# Patient Record
Sex: Female | Born: 1965 | Race: White | Hispanic: No | Smoking: Current every day smoker
Health system: Southern US, Community
[De-identification: ages and names within clinical notes are randomized; demographics above are authoritative.]

## PROBLEM LIST (undated history)

## (undated) DIAGNOSIS — M1990 Unspecified osteoarthritis, unspecified site: Secondary | ICD-10-CM

## (undated) DIAGNOSIS — N390 Urinary tract infection, site not specified: Secondary | ICD-10-CM

## (undated) DIAGNOSIS — E039 Hypothyroidism, unspecified: Secondary | ICD-10-CM

## (undated) DIAGNOSIS — I10 Essential (primary) hypertension: Secondary | ICD-10-CM

## (undated) DIAGNOSIS — L920 Granuloma annulare: Secondary | ICD-10-CM

## (undated) HISTORY — PX: OTHER SURGICAL HISTORY: SHX169

## (undated) HISTORY — DX: Urinary tract infection, site not specified: N39.0

## (undated) HISTORY — DX: Granuloma annulare: L92.0

## (undated) HISTORY — DX: Unspecified osteoarthritis, unspecified site: M19.90

## (undated) HISTORY — DX: Hypothyroidism, unspecified: E03.9

## (undated) HISTORY — DX: Essential (primary) hypertension: I10

---

## 1991-08-07 HISTORY — PX: TUBAL LIGATION: SHX77

## 1995-08-07 DIAGNOSIS — E039 Hypothyroidism, unspecified: Secondary | ICD-10-CM

## 1995-08-07 HISTORY — DX: Hypothyroidism, unspecified: E03.9

## 1999-04-21 ENCOUNTER — Ambulatory Visit (HOSPITAL_BASED_OUTPATIENT_CLINIC_OR_DEPARTMENT_OTHER): Admission: RE | Admit: 1999-04-21 | Discharge: 1999-04-21 | Payer: Self-pay | Admitting: General Surgery

## 1999-05-02 ENCOUNTER — Other Ambulatory Visit: Admission: RE | Admit: 1999-05-02 | Discharge: 1999-05-02 | Payer: Self-pay | Admitting: *Deleted

## 1999-05-02 ENCOUNTER — Encounter (INDEPENDENT_AMBULATORY_CARE_PROVIDER_SITE_OTHER): Payer: Self-pay | Admitting: Specialist

## 1999-05-23 ENCOUNTER — Encounter (INDEPENDENT_AMBULATORY_CARE_PROVIDER_SITE_OTHER): Payer: Self-pay

## 1999-05-23 ENCOUNTER — Other Ambulatory Visit: Admission: RE | Admit: 1999-05-23 | Discharge: 1999-05-23 | Payer: Self-pay | Admitting: Obstetrics and Gynecology

## 2003-03-20 ENCOUNTER — Emergency Department (HOSPITAL_COMMUNITY): Admission: EM | Admit: 2003-03-20 | Discharge: 2003-03-20 | Payer: Self-pay | Admitting: Emergency Medicine

## 2003-03-20 ENCOUNTER — Encounter: Payer: Self-pay | Admitting: Emergency Medicine

## 2005-06-20 ENCOUNTER — Encounter: Admission: RE | Admit: 2005-06-20 | Discharge: 2005-06-20 | Payer: Self-pay | Admitting: Family Medicine

## 2005-12-05 ENCOUNTER — Encounter: Admission: RE | Admit: 2005-12-05 | Discharge: 2006-01-24 | Payer: Self-pay | Admitting: Sports Medicine

## 2006-08-06 HISTORY — PX: APPENDECTOMY: SHX54

## 2008-12-07 ENCOUNTER — Ambulatory Visit: Payer: Self-pay

## 2008-12-07 ENCOUNTER — Ambulatory Visit: Payer: Self-pay | Admitting: Family Medicine

## 2008-12-07 DIAGNOSIS — R209 Unspecified disturbances of skin sensation: Secondary | ICD-10-CM | POA: Insufficient documentation

## 2008-12-07 DIAGNOSIS — N390 Urinary tract infection, site not specified: Secondary | ICD-10-CM | POA: Insufficient documentation

## 2008-12-07 DIAGNOSIS — R0602 Shortness of breath: Secondary | ICD-10-CM | POA: Insufficient documentation

## 2008-12-07 DIAGNOSIS — M79609 Pain in unspecified limb: Secondary | ICD-10-CM | POA: Insufficient documentation

## 2008-12-07 DIAGNOSIS — E669 Obesity, unspecified: Secondary | ICD-10-CM

## 2008-12-07 DIAGNOSIS — E0590 Thyrotoxicosis, unspecified without thyrotoxic crisis or storm: Secondary | ICD-10-CM | POA: Insufficient documentation

## 2008-12-13 ENCOUNTER — Telehealth (INDEPENDENT_AMBULATORY_CARE_PROVIDER_SITE_OTHER): Payer: Self-pay | Admitting: *Deleted

## 2008-12-27 ENCOUNTER — Encounter: Payer: Self-pay | Admitting: Family Medicine

## 2008-12-29 ENCOUNTER — Encounter (INDEPENDENT_AMBULATORY_CARE_PROVIDER_SITE_OTHER): Payer: Self-pay | Admitting: *Deleted

## 2009-01-24 ENCOUNTER — Encounter: Payer: Self-pay | Admitting: Pulmonary Disease

## 2009-10-03 ENCOUNTER — Ambulatory Visit: Payer: Self-pay | Admitting: Diagnostic Radiology

## 2009-10-03 ENCOUNTER — Encounter (INDEPENDENT_AMBULATORY_CARE_PROVIDER_SITE_OTHER): Payer: Self-pay

## 2009-10-03 ENCOUNTER — Encounter: Payer: Self-pay | Admitting: Emergency Medicine

## 2009-10-03 ENCOUNTER — Observation Stay (HOSPITAL_COMMUNITY): Admission: EM | Admit: 2009-10-03 | Discharge: 2009-10-04 | Payer: Self-pay

## 2010-10-26 LAB — CBC
HCT: 42.3 % (ref 36.0–46.0)
Hemoglobin: 14.4 g/dL (ref 12.0–15.0)
MCHC: 34.1 g/dL (ref 30.0–36.0)
MCV: 92.3 fL (ref 78.0–100.0)
Platelets: 187 10*3/uL (ref 150–400)
RBC: 4.58 MIL/uL (ref 3.87–5.11)
RDW: 13.6 % (ref 11.5–15.5)
WBC: 13.9 10*3/uL — ABNORMAL HIGH (ref 4.0–10.5)

## 2010-10-26 LAB — BASIC METABOLIC PANEL
BUN: 15 mg/dL (ref 6–23)
CO2: 27 mEq/L (ref 19–32)
Calcium: 8.8 mg/dL (ref 8.4–10.5)
Chloride: 108 mEq/L (ref 96–112)
Creatinine, Ser: 0.9 mg/dL (ref 0.4–1.2)
GFR calc Af Amer: >60 mL/min (ref >60)
GFR calc non Af Amer: >60 mL/min (ref >60)
Glucose, Bld: 98 mg/dL (ref 70–99)
Potassium: 4 mEq/L (ref 3.5–5.1)
Sodium: 141 mEq/L (ref 135–145)

## 2010-10-26 LAB — URINALYSIS, ROUTINE W REFLEX MICROSCOPIC
Bilirubin Urine: NEGATIVE
Glucose, UA: NEGATIVE mg/dL
Hgb urine dipstick: NEGATIVE
Ketones, ur: NEGATIVE mg/dL
Nitrite: NEGATIVE
Protein, ur: NEGATIVE mg/dL
Specific Gravity, Urine: 1.023 (ref 1.005–1.030)
Urobilinogen, UA: 0.2 mg/dL (ref 0.0–1.0)
pH: 7 (ref 5.0–8.0)

## 2010-10-26 LAB — DIFFERENTIAL
Basophils Absolute: 0 10*3/uL (ref 0.0–0.1)
Basophils Relative: 0 % (ref 0–1)
Eosinophils Absolute: 0.5 10*3/uL (ref 0.0–0.7)
Eosinophils Relative: 3 % (ref 0–5)
Lymphocytes Relative: 15 % (ref 12–46)
Lymphs Abs: 2.1 10*3/uL (ref 0.7–4.0)
Monocytes Absolute: 0.5 10*3/uL (ref 0.1–1.0)
Monocytes Relative: 4 % (ref 3–12)
Neutro Abs: 10.8 10*3/uL — ABNORMAL HIGH (ref 1.7–7.7)
Neutrophils Relative %: 78 % — ABNORMAL HIGH (ref 43–77)

## 2010-10-26 LAB — PREGNANCY, URINE: Preg Test, Ur: NEGATIVE

## 2010-10-31 ENCOUNTER — Emergency Department (INDEPENDENT_AMBULATORY_CARE_PROVIDER_SITE_OTHER): Payer: BC Managed Care – PPO

## 2010-10-31 ENCOUNTER — Emergency Department (HOSPITAL_BASED_OUTPATIENT_CLINIC_OR_DEPARTMENT_OTHER)
Admission: EM | Admit: 2010-10-31 | Discharge: 2010-10-31 | Disposition: A | Discharge status: Home / Self Care | Payer: BC Managed Care – PPO | Attending: Emergency Medicine | Admitting: Emergency Medicine

## 2010-10-31 DIAGNOSIS — M5137 Other intervertebral disc degeneration, lumbosacral region: Secondary | ICD-10-CM

## 2010-10-31 DIAGNOSIS — M5430 Sciatica, unspecified side: Secondary | ICD-10-CM | POA: Insufficient documentation

## 2010-10-31 DIAGNOSIS — M25559 Pain in unspecified hip: Secondary | ICD-10-CM

## 2010-10-31 DIAGNOSIS — E039 Hypothyroidism, unspecified: Secondary | ICD-10-CM | POA: Insufficient documentation

## 2010-10-31 DIAGNOSIS — M79609 Pain in unspecified limb: Secondary | ICD-10-CM | POA: Insufficient documentation

## 2010-12-22 ENCOUNTER — Ambulatory Visit: Payer: BC Managed Care – PPO | Admitting: Family Medicine

## 2010-12-25 ENCOUNTER — Encounter: Payer: Self-pay | Admitting: Family Medicine

## 2010-12-25 ENCOUNTER — Ambulatory Visit (INDEPENDENT_AMBULATORY_CARE_PROVIDER_SITE_OTHER): Payer: BC Managed Care – PPO | Admitting: Family Medicine

## 2010-12-25 VITALS — BP 154/95 | HR 84 | Temp 98.4°F | Ht 70.0 in | Wt 245.0 lb

## 2010-12-25 DIAGNOSIS — M545 Low back pain: Secondary | ICD-10-CM

## 2010-12-25 MED ORDER — HYDROCODONE-ACETAMINOPHEN 5-500 MG PO TABS: 1.0000 | ORAL_TABLET | Freq: Four times a day (QID) | ORAL | Status: AC | PRN

## 2010-12-25 MED ORDER — PREDNISONE (PAK) 10 MG PO TABS: ORAL_TABLET | ORAL | Status: DC

## 2010-12-25 MED ORDER — MELOXICAM 15 MG PO TABS: 15.0000 mg | ORAL_TABLET | Freq: Every day | ORAL | Status: AC

## 2010-12-25 NOTE — Patient Instructions (Signed)
You have lumbar radiculopathy (a pinched nerve in your low back). A prednisone dose pack is the best option for immediate relief and may be prescribed with transition to an anti-inflammatory like meloxicam (if you do not have stomach or kidney issues). Tramadol or vicodin as needed for severe pain (no driving on this medicine). Stay as active as possible. Physical therapy has been shown to be helpful as well. Strengthening of low back muscles, abdominal musculature are key for long term pain relief. If not improving, will consider further imaging (MRI) and/or other medications (neurontin, lyrica, nortriptyline) that help with pain. Follow up with me in 2 weeks (1 week if pain is the same or worse).

## 2010-12-25 NOTE — Assessment & Plan Note (Signed)
consistent with lumbar radiculopathy.  Mild DDD at L5-S1, pain is probably discogenic in nature.  Start prednisone dose pack with transition to mobic.  Did not like flexeril so will do without muscle relaxant.  Vicodin as needed for severe pain.  PT for exercises, stretches, modalities.  F/u in 2 weeks if improving, sooner if pain persists or worsens despite treatment.  Will consider MRI lumbar spine if not improving.

## 2010-12-25 NOTE — Progress Notes (Signed)
  Subjective:    Patient ID: Maria Robles, female    DOB: Apr 04, 1966, 45 y.o.   MRN: [id number]  HPI  45 yo F here with right low back pain.  Patient denies known injury. States about 3 months ago she did a lot of dancing one day - next day woke up and started to have slowly worsening low back pain, buttock pain with radiation into right leg past the knee. Worsened after long 11 1/2 hour drive home that day. Feels like she has spasms in buttock. + numbness and tingling lateral lower leg into foot. Went to ED in March - given valium then flexeril, oxycodone which helped with spasms but pain has persisted. Not taking nsaids and not doing any specific rehab or exercises. Tried acupuncture, some electrode therapy (at acupuncture place) without much help. Tried icing, sitting on bag of peas, massage, fish oil, vitamin D, home stretches. X-rays 3/27 showed mild DDD at L5-S1.  Past Medical History  Diagnosis Date  . Hypothyroidism     No current outpatient prescriptions on file prior to visit.    No past surgical history on file.  No Known Allergies  History   Social History  . Marital Status: Married    Spouse Name: N/A    Number of Children: N/A  . Years of Education: N/A   Occupational History  . Not on file.   Social History Main Topics  . Smoking status: Current Some Day Smoker  . Smokeless tobacco: Not on file   Comment: 1 pack per 2 weeks  . Alcohol Use: Not on file  . Drug Use: Not on file  . Sexually Active: Not on file   Other Topics Concern  . Not on file   Social History Narrative  . No narrative on file    Family History  Problem Relation Age of Onset  . Diabetes Neg Hx   . Heart attack Neg Hx   . Hypertension Neg Hx     BP 154/95  Pulse 84  Temp(Src) 98.4 F (36.9 C) (Oral)  Ht 5\' 10"  (1.778 m)  Wt 245 lb (111.131 kg)  BMI 35.15 kg/m2  Review of Systems See HPI above.    Objective:   Physical Exam Gen: NAD, overweight. Back: No  gross deformity, scoliosis. TTP just right of midline lumbar spine, right buttock.  No bony, midline, or left paraspinal TTP. FROM but pain on full flexion.  No pain with extension. Strength 5/5 BLEs. Sensation diminished lateral lower leg on right, intact on left. MSRs trace right patellar, 1+ left patellar, 2+ bilateral achilles tendons. Negative logroll bilateral hips SLR + on right, negative on left. Negative fabers and piriformis stretch (latter gives pain in low back).     Assessment & Plan:  1. Low back pain - consistent with lumbar radiculopathy.  Mild DDD at L5-S1, pain is probably discogenic in nature.  Start prednisone dose pack with transition to mobic.  Did not like flexeril so will do without muscle relaxant.  Vicodin as needed for severe pain.  PT for exercises, stretches, modalities.  F/u in 2 weeks if improving, sooner if pain persists or worsens despite treatment.  Will consider MRI lumbar spine if not improving.

## 2010-12-28 ENCOUNTER — Ambulatory Visit: Payer: BC Managed Care – PPO | Attending: Family Medicine | Admitting: Physical Therapy

## 2010-12-28 DIAGNOSIS — M545 Low back pain, unspecified: Secondary | ICD-10-CM | POA: Insufficient documentation

## 2010-12-28 DIAGNOSIS — M25559 Pain in unspecified hip: Secondary | ICD-10-CM | POA: Insufficient documentation

## 2010-12-28 DIAGNOSIS — IMO0001 Reserved for inherently not codable concepts without codable children: Secondary | ICD-10-CM | POA: Insufficient documentation

## 2011-01-03 ENCOUNTER — Ambulatory Visit: Payer: BC Managed Care – PPO | Admitting: Rehabilitation

## 2011-01-05 ENCOUNTER — Ambulatory Visit: Payer: BC Managed Care – PPO | Attending: Family Medicine | Admitting: Rehabilitation

## 2011-01-05 DIAGNOSIS — M545 Low back pain, unspecified: Secondary | ICD-10-CM | POA: Insufficient documentation

## 2011-01-05 DIAGNOSIS — M25559 Pain in unspecified hip: Secondary | ICD-10-CM | POA: Insufficient documentation

## 2011-01-05 DIAGNOSIS — IMO0001 Reserved for inherently not codable concepts without codable children: Secondary | ICD-10-CM | POA: Insufficient documentation

## 2011-01-09 ENCOUNTER — Ambulatory Visit: Payer: BC Managed Care – PPO | Admitting: Rehabilitation

## 2011-01-09 ENCOUNTER — Ambulatory Visit (INDEPENDENT_AMBULATORY_CARE_PROVIDER_SITE_OTHER): Payer: BC Managed Care – PPO | Admitting: Family Medicine

## 2011-01-09 ENCOUNTER — Encounter: Payer: Self-pay | Admitting: Family Medicine

## 2011-01-09 VITALS — BP 131/83 | HR 90 | Temp 98.1°F | Ht 70.0 in | Wt 240.0 lb

## 2011-01-09 DIAGNOSIS — M545 Low back pain: Secondary | ICD-10-CM

## 2011-01-09 NOTE — Assessment & Plan Note (Signed)
consistent with lumbar radiculopathy, significantly improved.  Continue with PT and transition to HEP.  Mobic as needed.  F/u prn - if pain worsens or persists, will consider MRI.

## 2011-01-09 NOTE — Progress Notes (Signed)
Subjective:    Patient ID: Maria Robles, female    DOB: [birth year], 45 y.o.   MRN: [id number]  HPI  45 yo F here with right low back pain.  Last OV 12/25/10: Patient denies known injury. States about 3 months ago she did a lot of dancing one day - next day woke up and started to have slowly worsening low back pain, buttock pain with radiation into right leg past the knee. Worsened after long 11 1/2 hour drive home that day. Feels like she has spasms in buttock. + numbness and tingling lateral lower leg into foot. Went to ED in March - given valium then flexeril, oxycodone which helped with spasms but pain has persisted. Not taking nsaids and not doing any specific rehab or exercises. Tried acupuncture, some electrode therapy (at acupuncture place) without much help. Tried icing, sitting on bag of peas, massage, fish oil, vitamin D, home stretches. X-rays 3/27 showed mild DDD at L5-S1.  Today: Patient has had significant improvement with PT, prednisone then mobic. No longer needing any medication. Pain not radiating into right leg any longer. If she overworks, next morning feels some spasms in right side lower back but they are now mild. No more numbness/tingling.  Past Medical History  Diagnosis Date  . Hypothyroidism     Current Outpatient Prescriptions on File Prior to Visit  Medication Sig Dispense Refill  . HYDROcodone-acetaminophen (VICODIN) 5-500 MG per tablet Take 1 tablet by mouth every 6 (six) hours as needed for pain.  40 tablet  0  . levothyroxine (SYNTHROID, LEVOTHROID) 100 MCG tablet Take 100 mcg by mouth daily.        . meloxicam (MOBIC) 15 MG tablet Take 1 tablet (15 mg total) by mouth daily. With food.  Start AFTER finishing prednisone.  30 tablet  1  . predniSONE (DELTASONE) 10 MG tablet pack 6 tabs po day 1, 5 tabs po day 2, 4 tabs po day 3, 3 tabs po day 4, 2 tabs po day 5, 1 tab po day 6   21 tablet  0    No past surgical history on file.  No Known  Allergies  History   Social History  . Marital Status: Married    Spouse Name: N/A    Number of Children: N/A  . Years of Education: N/A   Occupational History  . Not on file.   Social History Main Topics  . Smoking status: Current Some Day Smoker  . Smokeless tobacco: Not on file   Comment: 1 pack per 2 weeks  . Alcohol Use: Not on file  . Drug Use: Not on file  . Sexually Active: Not on file   Other Topics Concern  . Not on file   Social History Narrative  . No narrative on file    Family History  Problem Relation Age of Onset  . Diabetes Neg Hx   . Heart attack Neg Hx   . Hypertension Neg Hx     BP 131/83  Pulse 90  Temp(Src) 98.1 F (36.7 C) (Oral)  Ht 5\' 10"  (1.778 m)  Wt 240 lb (108.863 kg)  BMI 34.44 kg/m2  Review of Systems  See HPI above.    Objective:   Physical Exam  Gen: NAD, overweight. Back: No gross deformity, scoliosis. Mild TTP just right of midline lumbar spine, right buttock.  No bony, midline, or left paraspinal TTP. FROM with minimal pain on full flexion.  No pain with extension. Strength 5/5 BLEs.  Sensation intact BLEs (improved). MSRs trace bilateral patellar, 2+ bilateral achilles tendons. Negative logroll bilateral hips SLRs negative (improved).  Negative fabers and piriformis stretches     Assessment & Plan:  1. Low back pain - consistent with lumbar radiculopathy, significantly improved.  Continue with PT and transition to HEP.  Mobic as needed.  F/u prn - if pain worsens or persists, will consider MRI.

## 2011-01-11 ENCOUNTER — Encounter: Payer: BC Managed Care – PPO | Admitting: Physical Therapy

## 2011-01-17 ENCOUNTER — Ambulatory Visit: Payer: BC Managed Care – PPO | Admitting: Rehabilitation

## 2011-01-19 ENCOUNTER — Ambulatory Visit: Payer: BC Managed Care – PPO | Admitting: Rehabilitation

## 2011-01-29 ENCOUNTER — Encounter: Payer: Self-pay | Admitting: Family Medicine

## 2012-10-23 ENCOUNTER — Emergency Department (HOSPITAL_BASED_OUTPATIENT_CLINIC_OR_DEPARTMENT_OTHER)
Admission: EM | Admit: 2012-10-23 | Discharge: 2012-10-23 | Disposition: A | Discharge status: Home / Self Care | Payer: Self-pay | Attending: Emergency Medicine | Admitting: Emergency Medicine

## 2012-10-23 ENCOUNTER — Encounter (HOSPITAL_BASED_OUTPATIENT_CLINIC_OR_DEPARTMENT_OTHER): Payer: Self-pay | Admitting: *Deleted

## 2012-10-23 DIAGNOSIS — E039 Hypothyroidism, unspecified: Secondary | ICD-10-CM | POA: Insufficient documentation

## 2012-10-23 DIAGNOSIS — F17200 Nicotine dependence, unspecified, uncomplicated: Secondary | ICD-10-CM | POA: Insufficient documentation

## 2012-10-23 DIAGNOSIS — Z87440 Personal history of urinary (tract) infections: Secondary | ICD-10-CM | POA: Insufficient documentation

## 2012-10-23 DIAGNOSIS — Z79899 Other long term (current) drug therapy: Secondary | ICD-10-CM | POA: Insufficient documentation

## 2012-10-23 MED ORDER — LEVOTHYROXINE SODIUM 100 MCG PO TABS: 150.0000 ug | ORAL_TABLET | Freq: Every day | ORAL | Status: DC

## 2012-10-23 NOTE — ED Provider Notes (Signed)
History     CSN: [id number]  Arrival date & time 10/23/12  1447   First MD Initiated Contact with Patient 10/23/12 1536      Chief Complaint  Patient presents with  . Medication Refill    (Consider location/radiation/quality/duration/timing/severity/associated sxs/prior treatment) HPI Comments: Pt request a prescription for synthroid.  Pt reports she lost her insurance and is out.  Pt has been taking for 15 years.  Pt reports she feels tired and run down  The history is provided by the patient. No language interpreter was used.    Past Medical History  Diagnosis Date  . Hypothyroidism   . Chronic UTI     hx    Past Surgical History  Procedure Laterality Date  . Tubal ligation    . Benign tumor      R shoulder   . Appendectomy      Family History  Problem Relation Age of Onset  . Diabetes Neg Hx   . Heart attack Neg Hx   . Hypertension Neg Hx   . Coronary artery disease Neg Hx   . Stroke Neg Hx   . Cancer Neg Hx     breast or colon     History  Substance Use Topics  . Smoking status: Current Every Day Smoker -- 0.50 packs/day    Types: Cigarettes  . Smokeless tobacco: Never Used     Comment: 1 pack per 2 weeks  . Alcohol Use: Yes     Comment: occassionally    OB History   Grav Para Term Preterm Abortions TAB SAB Ect Mult Living                  Review of Systems  All other systems reviewed and are negative.    Allergies  Review of patient's allergies indicates no known allergies.  Home Medications   Current Outpatient Rx  Name  Route  Sig  Dispense  Refill  . ciprofloxacin (CIPRO) 500 MG tablet   Oral   Take 500 mg by mouth 2 (two) times daily as needed.           Marland Kitchen levothyroxine (SYNTHROID, LEVOTHROID) 100 MCG tablet   Oral   Take 1.5 tablets (150 mcg total) by mouth daily.   30 tablet   3   . naproxen (NAPROSYN) 500 MG tablet   Oral   Take 500 mg by mouth as needed.           . predniSONE (DELTASONE) 10 MG tablet pack     6 tabs po day 1, 5 tabs po day 2, 4 tabs po day 3, 3 tabs po day 4, 2 tabs po day 5, 1 tab po day 6    21 tablet   0   . zolpidem (AMBIEN CR) 12.5 MG CR tablet   Oral   Take 12.5 mg by mouth at bedtime as needed. Not sure of dose            BP 150/94  Pulse 73  Temp(Src) 98.1 F (36.7 C) (Oral)  Resp 18  SpO2 100%  LMP 10/09/2012  Physical Exam  Nursing note and vitals reviewed. Constitutional: She appears well-developed and well-nourished.  HENT:  Head: Normocephalic and atraumatic.  Eyes: Conjunctivae are normal. Pupils are equal, round, and reactive to light.  Neck: Normal range of motion. Neck supple.  Cardiovascular: Normal rate and normal heart sounds.   Pulmonary/Chest: Effort normal and breath sounds normal.  Musculoskeletal: Normal range of motion.  Neurological: She is alert.  Skin: Skin is warm.  Psychiatric: She has a normal mood and affect.    ED Course  Procedures (including critical care time)  Labs Reviewed - No data to display No results found.   1. Hypothyroid       MDM  Synthroid,   Pt advised to follow up with primary in April as scheduled        Elson Areas, PA-C 10/23/12 1630

## 2012-10-23 NOTE — ED Notes (Signed)
Pt states that she is out of her medication for Hypothyroidism, and is starting to feel like she is retaining fluid. She reports having an appointment at the free clinic on November 20, 2012, but is no longer comfortable waiting for her medication. Pt states that it is harder her to breath, she is cold and tired, which she states is the result of coming off of her medication.

## 2012-10-23 NOTE — ED Notes (Signed)
Pt states she has hypothyroidism and has been out of her medication for the last 2 weeks.  States she lost her insurance and is scheduled to go to the free clinic in [place] on 11/20/12.

## 2012-10-24 NOTE — ED Provider Notes (Signed)
Medical screening examination/treatment/procedure(s) were performed by non-physician practitioner and as supervising physician I was immediately available for consultation/collaboration.   Jaydin Jalomo, MD 10/24/12 0734 

## 2013-03-10 ENCOUNTER — Encounter: Payer: Self-pay | Admitting: Internal Medicine

## 2013-03-10 ENCOUNTER — Ambulatory Visit: Payer: Self-pay | Attending: Family Medicine | Admitting: Internal Medicine

## 2013-03-10 VITALS — BP 134/79 | HR 67 | Temp 98.0°F | Resp 17 | Wt 253.8 lb

## 2013-03-10 DIAGNOSIS — E039 Hypothyroidism, unspecified: Secondary | ICD-10-CM | POA: Insufficient documentation

## 2013-03-10 MED ORDER — LEVOTHYROXINE SODIUM 150 MCG PO TABS: 150.0000 ug | ORAL_TABLET | Freq: Every day | ORAL | Status: DC

## 2013-03-10 NOTE — Progress Notes (Signed)
Patient here to establish care Medication refill for thyroid

## 2013-03-10 NOTE — Progress Notes (Signed)
Patient ID: Maria Robles, female   DOB: Feb 21, 1966, 47 y.o.   MRN: [id number]  CC: To establish care and refill of medication  HPI: Maria Robles is a 47 years old woman with history of hypothyroidism came into clinic today to establish care. She has no specific complaints, no diarrhea, no constipation, no abdominal pain, no shortness of breath. She denies any chest, no skin dryness. She has not had a physical done in a long time because she has no primary care physician. There is no history of high Blood-pressure, no diabetes.  Allergies  Allergen Reactions  . Sulfa Antibiotics    Past Medical History  Diagnosis Date  . Hypothyroidism   . Chronic UTI     hx   Current Outpatient Prescriptions on File Prior to Visit  Medication Sig Dispense Refill  . ciprofloxacin (CIPRO) 500 MG tablet Take 500 mg by mouth 2 (two) times daily as needed.        . naproxen (NAPROSYN) 500 MG tablet Take 500 mg by mouth as needed.        . predniSONE (DELTASONE) 10 MG tablet pack 6 tabs po day 1, 5 tabs po day 2, 4 tabs po day 3, 3 tabs po day 4, 2 tabs po day 5, 1 tab po day 6   21 tablet  0  . zolpidem (AMBIEN CR) 12.5 MG CR tablet Take 12.5 mg by mouth at bedtime as needed. Not sure of dose        No current facility-administered medications on file prior to visit.   Family History  Problem Relation Age of Onset  . Diabetes Neg Hx   . Heart attack Neg Hx   . Hypertension Neg Hx   . Coronary artery disease Neg Hx   . Stroke Neg Hx   . Cancer Neg Hx     breast or colon    History   Social History  . Marital Status: Married    Spouse Name: N/A    Number of Children: N/A  . Years of Education: N/A   Occupational History  . Not on file.   Social History Main Topics  . Smoking status: Current Every Day Smoker -- 0.50 packs/day    Types: Cigarettes  . Smokeless tobacco: Never Used     Comment: 1 pack per 2 weeks  . Alcohol Use: Yes     Comment: occassionally  . Drug Use: No  . Sexually  Active: Not on file   Other Topics Concern  . Not on file   Social History Narrative   Married, 2 boys-1 grandchild.    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 HENT: Negative for ear pain, nosebleeds, congestion, facial swelling, rhinorrhea, neck pain, neck stiffness and ear discharge.  Eyes: Negative for pain, discharge, redness, itching and visual disturbance. Respiratory: Negative for cough, choking, chest tightness, shortness of breath, wheezing and stridor.  Cardiovascular: Negative for chest pain, palpitations and leg swelling. Gastrointestinal: Negative for abdominal distention. Genitourinary: Negative for dysuria, urgency, frequency, hematuria, flank pain, decreased urine volume, difficulty urinating and dyspareunia.  Musculoskeletal: Negative for back pain, joint swelling, arthralgias and gait problem. Neurological: Negative for dizziness, tremors, seizures, syncope, facial asymmetry, speech difficulty, weakness, light-headedness, numbness and headaches.  Hematological: Negative for adenopathy. Does not bruise/bleed easily. Psychiatric/Behavioral: Negative for hallucinations, behavioral problems, confusion, dysphoric mood, decreased concentration and agitation.  Objective:   Filed Vitals:   03/10/13 1153  BP: 134/79  Pulse:   Temp:   Resp:     Physical Exam: Constitutional: Patient appears well-developed and well-nourished. No distress. HENT: Normocephalic, atraumatic, External right and left ear normal. Oropharynx is clear and moist.  Eyes: Conjunctivae and EOM are normal. PERRLA, no scleral icterus. Neck: Normal ROM. Neck supple. No JVD. No tracheal deviation. No thyromegaly. CVS: RRR, S1/S2 +, no murmurs, no gallops, no carotid bruit.  Pulmonary: Effort and breath sounds normal, no stridor, rhonchi, wheezes, rales.  Abdominal: Soft. BS +,   no distension, tenderness, rebound or guarding.  Musculoskeletal: Normal range of motion. No edema and no tenderness.  Lymphadenopathy: No lymphadenopathy noted, cervical, inguinal or axillary Neuro: Alert. Normal reflexes, muscle tone coordination. No cranial nerve deficit. Skin: Skin is warm and dry. No rash noted. Not diaphoretic. No erythema. No pallor. Psychiatric: Normal mood and affect. Behavior, judgment, thought content normal.  Lab Results  Component Value Date   WBC 13.9* 10/03/2009   HGB 14.4 10/03/2009   HCT 42.3 10/03/2009   MCV 92.3 10/03/2009   PLT 187 10/03/2009   Lab Results  Component Value Date   CREATININE .9 10/03/2009   BUN 15 10/03/2009   NA 141 10/03/2009   K 4.0 10/03/2009   CL 108 10/03/2009   CO2 27 10/03/2009    No results found for this basename: HGBA1C   Lipid Panel  No results found for this basename: chol, trig, hdl, cholhdl, vldl, ldlcalc       Assessment and plan:   Patient Active Problem List   Diagnosis Date Noted  . Unspecified hypothyroidism 03/10/2013  . Low back pain 12/25/2010  . OBESITY 12/07/2008  . UTI'S, RECURRENT 12/07/2008  . LEG PAIN, RIGHT 12/07/2008  . ARM NUMBNESS 12/07/2008  . SHORTNESS OF BREATH 12/07/2008   Plan: Labs today: TSH and free T4 Levothyroxine 150 mcg tablet by mouth daily Patient is to schedule an appointment for a physical The patient was extensively counseled for smoking cessation Return to clinic in 2 months for followup and physical  Maria Robles was given clear instructions to go to ER or return to the clinic if symptoms don't improve, worsen or new problems develop.  Maria Robles verbalized understanding.  Maria Robles was told to call to get lab results if hasn't heard anything in the next week.        Maria Lewandowsky, MD [facility] [place], [place] [phone]   03/10/2013, 12:18 PM

## 2013-03-11 LAB — T4, FREE: Free T4: 0.55 ng/dL — ABNORMAL LOW (ref 0.80–1.80)

## 2013-05-11 ENCOUNTER — Ambulatory Visit: Payer: Self-pay

## 2013-06-26 ENCOUNTER — Other Ambulatory Visit: Payer: Self-pay | Admitting: Internal Medicine

## 2013-06-29 ENCOUNTER — Other Ambulatory Visit: Payer: Self-pay | Admitting: Internal Medicine

## 2013-06-29 NOTE — Telephone Encounter (Signed)
i refilled her medication but patient needs an appointment Has not been seen since august

## 2013-09-14 ENCOUNTER — Ambulatory Visit: Payer: Self-pay | Admitting: Internal Medicine

## 2013-11-09 ENCOUNTER — Other Ambulatory Visit: Payer: Self-pay | Admitting: *Deleted

## 2013-11-09 DIAGNOSIS — E039 Hypothyroidism, unspecified: Secondary | ICD-10-CM

## 2013-11-09 MED ORDER — LEVOTHYROXINE SODIUM 150 MCG PO TABS: 150.0000 ug | ORAL_TABLET | Freq: Every day | ORAL | Status: DC

## 2013-12-22 ENCOUNTER — Ambulatory Visit: Payer: Self-pay | Admitting: Internal Medicine

## 2014-03-08 ENCOUNTER — Ambulatory Visit: Payer: Self-pay | Admitting: Family Medicine

## 2014-04-15 ENCOUNTER — Emergency Department (HOSPITAL_BASED_OUTPATIENT_CLINIC_OR_DEPARTMENT_OTHER)
Admission: EM | Admit: 2014-04-15 | Discharge: 2014-04-15 | Disposition: A | Discharge status: Home / Self Care | Payer: PRIVATE HEALTH INSURANCE | Attending: Emergency Medicine | Admitting: Emergency Medicine

## 2014-04-15 ENCOUNTER — Encounter (HOSPITAL_BASED_OUTPATIENT_CLINIC_OR_DEPARTMENT_OTHER): Payer: Self-pay | Admitting: Emergency Medicine

## 2014-04-15 ENCOUNTER — Emergency Department (HOSPITAL_BASED_OUTPATIENT_CLINIC_OR_DEPARTMENT_OTHER): Payer: PRIVATE HEALTH INSURANCE

## 2014-04-15 DIAGNOSIS — R0789 Other chest pain: Secondary | ICD-10-CM | POA: Diagnosis not present

## 2014-04-15 DIAGNOSIS — Z791 Long term (current) use of non-steroidal anti-inflammatories (NSAID): Secondary | ICD-10-CM | POA: Insufficient documentation

## 2014-04-15 DIAGNOSIS — Z792 Long term (current) use of antibiotics: Secondary | ICD-10-CM | POA: Diagnosis not present

## 2014-04-15 DIAGNOSIS — F17200 Nicotine dependence, unspecified, uncomplicated: Secondary | ICD-10-CM | POA: Diagnosis not present

## 2014-04-15 DIAGNOSIS — R0602 Shortness of breath: Secondary | ICD-10-CM | POA: Diagnosis not present

## 2014-04-15 DIAGNOSIS — H8110 Benign paroxysmal vertigo, unspecified ear: Secondary | ICD-10-CM

## 2014-04-15 DIAGNOSIS — IMO0002 Reserved for concepts with insufficient information to code with codable children: Secondary | ICD-10-CM | POA: Insufficient documentation

## 2014-04-15 DIAGNOSIS — Z87440 Personal history of urinary (tract) infections: Secondary | ICD-10-CM | POA: Diagnosis not present

## 2014-04-15 DIAGNOSIS — Z760 Encounter for issue of repeat prescription: Secondary | ICD-10-CM | POA: Insufficient documentation

## 2014-04-15 DIAGNOSIS — R42 Dizziness and giddiness: Secondary | ICD-10-CM | POA: Diagnosis not present

## 2014-04-15 DIAGNOSIS — Z79899 Other long term (current) drug therapy: Secondary | ICD-10-CM | POA: Insufficient documentation

## 2014-04-15 DIAGNOSIS — E039 Hypothyroidism, unspecified: Secondary | ICD-10-CM | POA: Diagnosis not present

## 2014-04-15 LAB — CBC WITH DIFFERENTIAL/PLATELET
BASOS ABS: 0 10*3/uL (ref 0.0–0.1)
BASOS PCT: 0 % (ref 0–1)
Eosinophils Absolute: 0.3 10*3/uL (ref 0.0–0.7)
Eosinophils Relative: 4 % (ref 0–5)
HCT: 40.7 % (ref 36.0–46.0)
Hemoglobin: 13.8 g/dL (ref 12.0–15.0)
LYMPHS PCT: 34 % (ref 12–46)
Lymphs Abs: 2.5 10*3/uL (ref 0.7–4.0)
MCH: 31.9 pg (ref 26.0–34.0)
MCHC: 33.9 g/dL (ref 30.0–36.0)
MCV: 94 fL (ref 78.0–100.0)
Monocytes Absolute: 0.5 10*3/uL (ref 0.1–1.0)
Monocytes Relative: 7 % (ref 3–12)
NEUTROS ABS: 4.1 10*3/uL (ref 1.7–7.7)
NEUTROS PCT: 55 % (ref 43–77)
PLATELETS: 180 10*3/uL (ref 150–400)
RBC: 4.33 MIL/uL (ref 3.87–5.11)
RDW: 14.3 % (ref 11.5–15.5)
WBC: 7.4 10*3/uL (ref 4.0–10.5)

## 2014-04-15 LAB — BASIC METABOLIC PANEL
ANION GAP: 10 (ref 5–15)
BUN: 22 mg/dL (ref 6–23)
CHLORIDE: 102 meq/L (ref 96–112)
CO2: 27 meq/L (ref 19–32)
Calcium: 9.6 mg/dL (ref 8.4–10.5)
Creatinine, Ser: 1.2 mg/dL — ABNORMAL HIGH (ref 0.50–1.10)
GFR calc Af Amer: 60 mL/min — ABNORMAL LOW (ref >90)
GFR calc non Af Amer: 52 mL/min — ABNORMAL LOW (ref >90)
Glucose, Bld: 97 mg/dL (ref 70–99)
POTASSIUM: 4 meq/L (ref 3.7–5.3)
SODIUM: 139 meq/L (ref 137–147)

## 2014-04-15 LAB — TSH: TSH: >100 u[IU]/mL — ABNORMAL HIGH (ref 0.350–4.500)

## 2014-04-15 LAB — TROPONIN I: Troponin I: <0.3 ng/mL (ref <0.30)

## 2014-04-15 MED ORDER — MECLIZINE HCL 12.5 MG PO TABS: 25.0000 mg | ORAL_TABLET | Freq: Two times a day (BID) | ORAL | Status: DC

## 2014-04-15 MED ORDER — LEVOTHYROXINE SODIUM 150 MCG PO TABS: 150.0000 ug | ORAL_TABLET | Freq: Every day | ORAL | Status: DC

## 2014-04-15 NOTE — ED Provider Notes (Addendum)
Medical screening examination/treatment/procedure(s) were conducted as a shared visit with non-physician practitioner(s) and myself.  I personally evaluated the patient during the encounter.   EKG Interpretation None      Date: 04/15/2014  Rate: 60  Rhythm: normal sinus rhythm  QRS Axis: normal  Intervals: normal  ST/T Wave abnormalities: normal  Conduction Disutrbances:none  Narrative Interpretation:   Old EKG Reviewed: none available  Patient with history of hypothyroidism. Patient has been out of her Synthroid for approximately 8 weeks. Patient is having symptoms consistent with hypothyroidism in addition to some shortness of breath brain fog and perhaps vertigo. Here we will send off a baseline TSH. Patient's EKG without any acute changes. Patient will have basic lab workup as well. Patient will be restarted on her Synthroid.   Patient also with complaint of of additional type vertigo for the past 6 weeks. Patient said vertigo in the past as well but has never had a workup for her. Patient never had an MRI or head CT. We will get that done today. If that is negative patient can be discharged home on her Synthroid.    Results for orders placed during the hospital encounter of 04/15/14  TROPONIN I      Result Value Ref Range   Troponin I <0.30  <0.30 ng/mL  CBC WITH DIFFERENTIAL      Result Value Ref Range   WBC 7.4  4.0 - 10.5 K/uL   RBC 4.33  3.87 - 5.11 MIL/uL   Hemoglobin 13.8  12.0 - 15.0 g/dL   HCT 96.0  45.4 - 09.8 %   MCV 94.0  78.0 - 100.0 fL   MCH 31.9  26.0 - 34.0 pg   MCHC 33.9  30.0 - 36.0 g/dL   RDW 11.9  14.7 - 82.9 %   Platelets 180  150 - 400 K/uL   Neutrophils Relative % 55  43 - 77 %   Neutro Abs 4.1  1.7 - 7.7 K/uL   Lymphocytes Relative 34  12 - 46 %   Lymphs Abs 2.5  0.7 - 4.0 K/uL   Monocytes Relative 7  3 - 12 %   Monocytes Absolute 0.5  0.1 - 1.0 K/uL   Eosinophils Relative 4  0 - 5 %   Eosinophils Absolute 0.3  0.0 - 0.7 K/uL   Basophils  Relative 0  0 - 1 %   Basophils Absolute 0.0  0.0 - 0.1 K/uL  BASIC METABOLIC PANEL      Result Value Ref Range   Sodium 139  137 - 147 mEq/L   Potassium 4.0  3.7 - 5.3 mEq/L   Chloride 102  96 - 112 mEq/L   CO2 27  19 - 32 mEq/L   Glucose, Bld 97  70 - 99 mg/dL   BUN 22  6 - 23 mg/dL   Creatinine, Ser 5.62 (*) 0.50 - 1.10 mg/dL   Calcium 9.6  8.4 - 13.0 mg/dL   GFR calc non Af Amer 52 (*) >90 mL/min   GFR calc Af Amer 60 (*) >90 mL/min   Anion gap 10  5 - 15   Dg Chest 2 View  04/15/2014   CLINICAL DATA:  Shortness of breath and chest pain.  EXAM: CHEST  2 VIEW  COMPARISON:  None.  FINDINGS: Heart size and mediastinal contours are within normal limits. Both lungs are clear. Visualized skeletal structures are unremarkable.  IMPRESSION: Negative exam.   Electronically Signed   By: Drusilla Kanner  M.D.   On: 04/15/2014 13:00   Ct Head Wo Contrast  04/15/2014   CLINICAL DATA:  48 year old female with a history of hypothyroidism. Vertigo. Shortness of breath.  EXAM: CT HEAD WITHOUT CONTRAST  TECHNIQUE: Contiguous axial images were obtained from the base of the skull through the vertex without intravenous contrast.  COMPARISON:  None.  FINDINGS: Unremarkable appearance of the calvarium with no fracture, or aggressive bone lesion.  Unremarkable appearance the soft tissues. Unremarkable appearance of the orbits.  No significant paranasal sinus disease. Mastoid air cells remain patent.  No intracranial hemorrhage, midline shift, mass effect. Gray-white differentiation maintained with no evidence of acute ischemia. Unremarkable configuration of the ventricular system.  IMPRESSION: No CT evidence of acute intracranial abnormality.  Signed,  Yvone Neu. Loreta Ave, DO  [facility]  [facility]   Electronically Signed   By: Gilmer Mor O.D.   On: 04/15/2014 14:49    Patient's CT of her head is without any significant findings. Chest x-ray is negative basic labs  without significant abnormalities. Patient's glucose is normal. Patient will be started on her Synthroid started on anti-birth for the benign positional vertigo.     Vanetta Mulders, MD 04/15/14 1326  Vanetta Mulders, MD 04/15/14 1431  Vanetta Mulders, MD 04/15/14 1531

## 2014-04-15 NOTE — ED Notes (Signed)
States she ran out of her Synthroid 8 weeks ago. She cannot see her MD for a refil for 3 more weeks. She states she is now having symptoms of hypothyroidism with vertigo, sob, and brain fog.

## 2014-04-15 NOTE — Discharge Instructions (Signed)
Continue taking Synthroid as prescribed. Followup with your primary care physician. Use meclizine 2 times a day as needed for vertigo/dizziness. Return to the ER if symptoms worsen.  Hypothyroidism The thyroid is a large gland located in the lower front of your neck. The thyroid gland helps control metabolism. Metabolism is how your body handles food. It controls metabolism with the hormone thyroxine. When this gland is underactive (hypothyroid), it produces too little hormone.  CAUSES These include:   Absence or destruction of thyroid tissue.  Goiter due to iodine deficiency.  Goiter due to medications.  Congenital defects (since birth).  Problems with the pituitary. This causes a lack of TSH (thyroid stimulating hormone). This hormone tells the thyroid to turn out more hormone. SYMPTOMS  Lethargy (feeling as though you have no energy)  Cold intolerance  Weight gain (in spite of normal food intake)  Dry skin  Coarse hair  Menstrual irregularity (if severe, may lead to infertility)  Slowing of thought processes Cardiac problems are also caused by insufficient amounts of thyroid hormone. Hypothyroidism in the newborn is cretinism, and is an extreme form. It is important that this form be treated adequately and immediately or it will lead rapidly to retarded physical and mental development. DIAGNOSIS  To prove hypothyroidism, your caregiver may do blood tests and ultrasound tests. Sometimes the signs are hidden. It may be necessary for your caregiver to watch this illness with blood tests either before or after diagnosis and treatment. TREATMENT  Low levels of thyroid hormone are increased by using synthetic thyroid hormone. This is a safe, effective treatment. It usually takes about four weeks to gain the full effects of the medication. After you have the full effect of the medication, it will generally take another four weeks for problems to leave. Your caregiver may start you on  low doses. If you have had heart problems the dose may be gradually increased. It is generally not an emergency to get rapidly to normal. HOME CARE INSTRUCTIONS   Take your medications as your caregiver suggests. Let your caregiver know of any medications you are taking or start taking. Your caregiver will help you with dosage schedules.  As your condition improves, your dosage needs may increase. It will be necessary to have continuing blood tests as suggested by your caregiver.  Report all suspected medication side effects to your caregiver. SEEK MEDICAL CARE IF: Seek medical care if you develop:  Sweating.  Tremulousness (tremors).  Anxiety.  Rapid weight loss.  Heat intolerance.  Emotional swings.  Diarrhea.  Weakness. SEEK IMMEDIATE MEDICAL CARE IF:  You develop chest pain, an irregular heart beat (palpitations), or a rapid heart beat. MAKE SURE YOU:   Understand these instructions.  Will watch your condition.  Will get help right away if you are not doing well or get worse. Document Released: 07/23/2005 Document Revised: 10/15/2011 Document Reviewed: 03/12/2008 [facility] Patient Information 2015 [place], [place]. This information is not intended to replace advice given to you by your health care provider. Make sure you discuss any questions you have with your health care provider.  Benign Positional Vertigo Vertigo means you feel like you or your surroundings are moving when they are not. Benign positional vertigo is the most common form of vertigo. Benign means that the cause of your condition is not serious. Benign positional vertigo is more common in older adults. CAUSES  Benign positional vertigo is the result of an upset in the labyrinth system. This is an area in the middle  ear that helps control your balance. This may be caused by a viral infection, head injury, or repetitive motion. However, often no specific cause is found. SYMPTOMS  Symptoms of benign  positional vertigo occur when you move your head or eyes in different directions. Some of the symptoms may include:  Loss of balance and falls.  Vomiting.  Blurred vision.  Dizziness.  Nausea.  Involuntary eye movements (nystagmus). DIAGNOSIS  Benign positional vertigo is usually diagnosed by physical exam. If the specific cause of your benign positional vertigo is unknown, your caregiver may perform imaging tests, such as magnetic resonance imaging (MRI) or computed tomography (CT). TREATMENT  Your caregiver may recommend movements or procedures to correct the benign positional vertigo. Medicines such as meclizine, benzodiazepines, and medicines for nausea may be used to treat your symptoms. In rare cases, if your symptoms are caused by certain conditions that affect the inner ear, you may need surgery. HOME CARE INSTRUCTIONS   Follow your caregiver's instructions.  Move slowly. Do not make sudden body or head movements.  Avoid driving.  Avoid operating heavy machinery.  Avoid performing any tasks that would be dangerous to you or others during a vertigo episode.  Drink enough fluids to keep your urine clear or pale yellow. SEEK IMMEDIATE MEDICAL CARE IF:   You develop problems with walking, weakness, numbness, or using your arms, hands, or legs.  You have difficulty speaking.  You develop severe headaches.  Your nausea or vomiting continues or gets worse.  You develop visual changes.  Your family or friends notice any behavioral changes.  Your condition gets worse.  You have a fever.  You develop a stiff neck or sensitivity to light. MAKE SURE YOU:   Understand these instructions.  Will watch your condition.  Will get help right away if you are not doing well or get worse. Document Released: 04/30/2006 Document Revised: 10/15/2011 Document Reviewed: 04/12/2011 [facility] Patient Information 2015 [place], [place]. This information is not intended to replace  advice given to you by your health care provider. Make sure you discuss any questions you have with your health care provider.

## 2014-04-15 NOTE — ED Provider Notes (Signed)
CSN: [id number]     Arrival date & time 04/15/14  1123 History   First MD Initiated Contact with Patient 04/15/14 1137     Chief Complaint  Patient presents with  . Medication Refill     (Consider location/radiation/quality/duration/timing/severity/associated sxs/prior Treatment) HPI Maria Robles is a 48 year old female who presents to the ER today for a refill on her Synthroid. Patient states she ran out of her Synthroid approximately 8 weeks ago, and noticed a gradual onset of fatigue approximately 6 weeks ago. Patient states approximately 3 weeks ago she also noted some shortness of breath" chest pressure". Patient states her fatigue is constant, and her shortness of breath and chest pressure occur while she is sedentary, and are alleviated by movement or activity. She states she has taken a multivitamin to help her symptoms with no relief. Patient states she's also been experiencing some dizziness which occurs when she turns her head while lying flat, and when she "sits up too fast". Patient denies associated lightheadedness, palpitations, weakness, nausea, vomiting, diarrhea, dysuria, abdominal pain. Patient states her symptoms feel identical to when she has been without Synthroid in the past.  Past Medical History  Diagnosis Date  . Hypothyroidism   . Chronic UTI     hx   Past Surgical History  Procedure Laterality Date  . Tubal ligation    . Benign tumor      R shoulder   . Appendectomy     Family History  Problem Relation Age of Onset  . Diabetes Neg Hx   . Heart attack Neg Hx   . Hypertension Neg Hx   . Coronary artery disease Neg Hx   . Stroke Neg Hx   . Cancer Neg Hx     breast or colon    History  Substance Use Topics  . Smoking status: Current Every Day Smoker -- 0.50 packs/day    Types: Cigarettes  . Smokeless tobacco: Never Used     Comment: 1 pack per 2 weeks  . Alcohol Use: Yes     Comment: occassionally   OB History   Grav Para Term Preterm Abortions TAB SAB  Ect Mult Living                 Review of Systems  Constitutional: Positive for fatigue. Negative for fever, chills, diaphoresis and unexpected weight change.  HENT: Negative for trouble swallowing and voice change.   Eyes: Negative for visual disturbance.  Respiratory: Positive for shortness of breath.   Cardiovascular: Positive for chest pain. Negative for palpitations and leg swelling.  Gastrointestinal: Negative for nausea, vomiting and abdominal pain.  Genitourinary: Negative for dysuria.  Musculoskeletal: Negative for neck pain.  Skin: Negative for rash.  Neurological: Positive for dizziness. Negative for syncope, speech difficulty, weakness, light-headedness, numbness and headaches.  Psychiatric/Behavioral: Negative.       Allergies  Sulfa antibiotics  Home Medications   Prior to Admission medications   Medication Sig Start Date End Date Taking? Authorizing Provider  ciprofloxacin (CIPRO) 500 MG tablet Take 500 mg by mouth 2 (two) times daily as needed.      Historical Provider, MD  levothyroxine (SYNTHROID, LEVOTHROID) 150 MCG tablet Take 1 tablet (150 mcg total) by mouth daily before breakfast. 04/15/14   Monte Fantasia, PA-C  meclizine (ANTIVERT) 12.5 MG tablet Take 2 tablets (25 mg total) by mouth 2 (two) times daily. 04/15/14   Monte Fantasia, PA-C  naproxen (NAPROSYN) 500 MG tablet Take 500 mg by mouth as  needed.      Historical Provider, MD  predniSONE (DELTASONE) 10 MG tablet pack 6 tabs po day 1, 5 tabs po day 2, 4 tabs po day 3, 3 tabs po day 4, 2 tabs po day 5, 1 tab po day 6  12/25/10   Lenda Kelp, MD  zolpidem (AMBIEN CR) 12.5 MG CR tablet Take 12.5 mg by mouth at bedtime as needed. Not sure of dose     Historical Provider, MD   BP 161/95  Pulse 76  Temp(Src) 98.6 F (37 C) (Oral)  Resp 20  Ht  (1.778 m)  Wt 235 lb (106.595 kg)  BMI 33.72 kg/m2  SpO2 100% Physical Exam  Nursing note and vitals reviewed. Constitutional: She is oriented to  person, place, and time. She appears well-developed and well-nourished. No distress.  HENT:  Head: Normocephalic and atraumatic.  Mouth/Throat: Oropharynx is clear and moist. No oropharyngeal exudate.  Eyes: Right eye exhibits no discharge. Left eye exhibits no discharge. No scleral icterus.  Neck: Normal range of motion.  Cardiovascular: Normal rate, regular rhythm and normal heart sounds.   No murmur heard. Pulmonary/Chest: Effort normal and breath sounds normal. No respiratory distress.  Abdominal: Soft. There is no tenderness.  Musculoskeletal: Normal range of motion. She exhibits no edema and no tenderness.  Neurological: She is alert and oriented to person, place, and time. She has normal strength. No cranial nerve deficit or sensory deficit. She displays a negative Romberg sign. Coordination and gait normal.  Skin: Skin is warm and dry. No rash noted. She is not diaphoretic.  Psychiatric: She has a normal mood and affect.    ED Course  Procedures (including critical care time) Labs Review Labs Reviewed  TSH - Abnormal; Notable for the following:    TSH >100.000 (*)    All other components within normal limits  BASIC METABOLIC PANEL - Abnormal; Notable for the following:    Creatinine, Ser 1.20 (*)    GFR calc non Af Amer 52 (*)    GFR calc Af Amer 60 (*)    All other components within normal limits  TROPONIN I  CBC WITH DIFFERENTIAL    Imaging Review Dg Chest 2 View  04/15/2014   CLINICAL DATA:  Shortness of breath and chest pain.  EXAM: CHEST  2 VIEW  COMPARISON:  None.  FINDINGS: Heart size and mediastinal contours are within normal limits. Both lungs are clear. Visualized skeletal structures are unremarkable.  IMPRESSION: Negative exam.   Electronically Signed   By: Drusilla Kanner M.D.   On: 04/15/2014 13:00   Ct Head Wo Contrast  04/15/2014   CLINICAL DATA:  48 year old female with a history of hypothyroidism. Vertigo. Shortness of breath.  EXAM: CT HEAD WITHOUT  CONTRAST  TECHNIQUE: Contiguous axial images were obtained from the base of the skull through the vertex without intravenous contrast.  COMPARISON:  None.  FINDINGS: Unremarkable appearance of the calvarium with no fracture, or aggressive bone lesion.  Unremarkable appearance the soft tissues. Unremarkable appearance of the orbits.  No significant paranasal sinus disease. Mastoid air cells remain patent.  No intracranial hemorrhage, midline shift, mass effect. Gray-white differentiation maintained with no evidence of acute ischemia. Unremarkable configuration of the ventricular system.  IMPRESSION: No CT evidence of acute intracranial abnormality.  Signed,  Yvone Neu. Loreta Ave, DO  [facility]  [facility]   Electronically Signed   By: Gilmer Mor O.D.   On: 04/15/2014 14:49  EKG Interpretation None      MDM   Final diagnoses:  Hypothyroidism, unspecified hypothyroidism type  Benign positional vertigo, unspecified laterality   48 year old female with past medical history of hypothyroidism presenting with 6 weeks of generalized fatigue and 3 weeks of chest "tightness" and shortness of breath. Patient stating her symptoms are identical to when she has run out of her Synthroid the past. Patient states she has not had a refill and been out of her Synthroid for approximately 8 weeks. Patient also reporting some "vertigo symptoms".  Patient notes some mild dizziness when moving from a lying to a sitting position, and when turning her head to the left while lying down. The dizziness subsides after she "sits still". W/u to include CBC, BMP, Troponin, EKG 12 lead, TSH.    Workup for chest pain and other causes of dizziness unremarkable. Well criteria negative for PE. patient EKG, troponin, chest x-ray negative. Based on labs, EKG and radiographic findings, along with patient's history of 3 weeks of this chest heaviness, which is relieved with activity, her pain  is not consistent with cardiac etiology. Head CT negative for any intracranial pathology. Patient's dizziness positional, with negative orthostatic vital signs and neuro exam benign. Patient remains nontoxic appearing and speaks in full, clear sentences in no acute distress. We will discharge patient at this time with prescription of meclizine, Synthroid and have her followup with her PCP. We encouraged patient to call or return to the ER should her symptoms change, worsen or should she have a questions or concerns.   BP 161/95  Pulse 76  Temp(Src) 98.6 F (37 C) (Oral)  Resp 20  Ht  (1.778 m)  Wt 235 lb (106.595 kg)  BMI 33.72 kg/m2  SpO2 100%   Signed,  Ladona Mow, PA-C 12:28 AM  This patient seen and discussed with doctor Vanetta Mulders, M.D.  Monte Fantasia, PA-C 04/16/14 0028

## 2014-08-06 DIAGNOSIS — L920 Granuloma annulare: Secondary | ICD-10-CM

## 2014-08-06 HISTORY — DX: Granuloma annulare: L92.0

## 2015-02-22 DIAGNOSIS — I10 Essential (primary) hypertension: Secondary | ICD-10-CM | POA: Insufficient documentation

## 2015-03-26 DIAGNOSIS — E039 Hypothyroidism, unspecified: Secondary | ICD-10-CM | POA: Insufficient documentation

## 2015-03-26 DIAGNOSIS — L920 Granuloma annulare: Secondary | ICD-10-CM

## 2015-03-26 DIAGNOSIS — R030 Elevated blood-pressure reading, without diagnosis of hypertension: Secondary | ICD-10-CM

## 2015-03-26 DIAGNOSIS — Z87440 Personal history of urinary (tract) infections: Secondary | ICD-10-CM | POA: Insufficient documentation

## 2015-05-24 ENCOUNTER — Ambulatory Visit: Payer: Self-pay | Admitting: Internal Medicine

## 2015-06-07 HISTORY — PX: OTHER SURGICAL HISTORY: SHX169

## 2015-06-22 ENCOUNTER — Encounter: Payer: Self-pay | Admitting: Internal Medicine

## 2015-06-22 ENCOUNTER — Ambulatory Visit (INDEPENDENT_AMBULATORY_CARE_PROVIDER_SITE_OTHER): Payer: Self-pay | Admitting: Internal Medicine

## 2015-06-22 VITALS — BP 150/94 | HR 80 | Ht 70.0 in | Wt 254.0 lb

## 2015-06-22 DIAGNOSIS — I889 Nonspecific lymphadenitis, unspecified: Secondary | ICD-10-CM

## 2015-06-22 MED ORDER — CEFTRIAXONE SODIUM 1 G IJ SOLR
1.0000 g | Freq: Once | INTRAMUSCULAR | Status: AC
  Administered 2015-06-22: 1 g via INTRAMUSCULAR
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
## 2015-06-23 ENCOUNTER — Ambulatory Visit (INDEPENDENT_AMBULATORY_CARE_PROVIDER_SITE_OTHER): Payer: No Typology Code available for payment source | Admitting: Internal Medicine

## 2015-06-23 VITALS — BP 140/90

## 2015-06-23 DIAGNOSIS — I889 Nonspecific lymphadenitis, unspecified: Secondary | ICD-10-CM

## 2015-06-23 NOTE — Progress Notes (Signed)
   Subjective:    Patient ID: Skipper ClicheStephanie N Hottel, female    DOB: November 07, 1965, 49 y.o.   MRN: [id number]  HPI  Left cervical lymphadenitis:  No fever.  Taking Ibuprofen rotating Acetominophen every 4 hours.  Helps with pain.  No definite fever.  No myalgias, very fatigued.  Left ear and jaw hurt.  Hurts to lie on left side.  No significant change from yesterday with pain. Swelling might be a bit more pronounced on left jaw today.      Review of Systems     Objective:   Physical Exam HEENT:  PERRL, EOMI, TMs pearly gray, throat without injection swelling,  or exudate, Broken left mandibular tooth still with pain on tapping, but no surrounding erythema or swelling of gingiva.   Nasal mucosa remains boggy and swollen with clear discharge Mastoids nontender and without swelling. Neck:  Induration without overlying erythema of skin still involving left anterior cervical area with induration now creeping over about mid mandible.  Tender to touch. No fluctuance.  Flushing of skin at anterior base of neck. Chest:  CTA CV:  RRR without murmur or rub       Assessment & Plan:  Left anterior cervical lymphadenitis with possible dental abscess as well. No improvement.  Switch [facility]. Warm pack area of swelling at least twice daily. Alternating ibuprofen and Tylenol for pain. Follow up again tomorrow afternoon.

## 2015-06-24 ENCOUNTER — Encounter: Payer: Self-pay | Admitting: Internal Medicine

## 2015-06-24 ENCOUNTER — Ambulatory Visit (INDEPENDENT_AMBULATORY_CARE_PROVIDER_SITE_OTHER): Payer: No Typology Code available for payment source | Admitting: Internal Medicine

## 2015-06-24 VITALS — BP 158/92 | HR 68 | Temp 98.0°F | Resp 16 | Wt 251.5 lb

## 2015-06-24 DIAGNOSIS — I889 Nonspecific lymphadenitis, unspecified: Secondary | ICD-10-CM

## 2015-06-24 MED ORDER — CEFTRIAXONE SODIUM 1 G IJ SOLR: 1.0000 g | Freq: Once | INTRAMUSCULAR | Status: DC

## 2015-06-24 MED ORDER — HYDROCODONE-ACETAMINOPHEN 5-325 MG PO TABS: ORAL_TABLET | ORAL | Status: DC

## 2015-06-24 MED ORDER — AMOXICILLIN-POT CLAVULANATE 875-125 MG PO TABS: 1.0000 | ORAL_TABLET | Freq: Two times a day (BID) | ORAL | Status: DC

## 2015-06-24 NOTE — Patient Instructions (Signed)
Continue Augmentin If increasing swelling and pain or worsening problems with swallowing, go to ED over weekend Call clinic if any problems over weekend and call will come to me. Stop plain Tylenol May try Aleve 2 tabs twice daily for pain Continue warm compresses to area of swelling and pain 2-4 times daily, especially after taking antibiotics.

## 2015-06-24 NOTE — Patient Instructions (Signed)
Warm pack area of swelling at least twice daily, about 1/2 hour after taking antibiotic.
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
## 2015-06-26 ENCOUNTER — Emergency Department (HOSPITAL_COMMUNITY)
Admission: EM | Admit: 2015-06-26 | Discharge: 2015-06-26 | Disposition: A | Discharge status: Other Acute Inpatient Hospital | Payer: Self-pay | Attending: Emergency Medicine | Admitting: Emergency Medicine

## 2015-06-26 ENCOUNTER — Emergency Department (HOSPITAL_COMMUNITY): Payer: PRIVATE HEALTH INSURANCE

## 2015-06-26 ENCOUNTER — Encounter (HOSPITAL_COMMUNITY): Payer: Self-pay

## 2015-06-26 DIAGNOSIS — Z79899 Other long term (current) drug therapy: Secondary | ICD-10-CM | POA: Insufficient documentation

## 2015-06-26 DIAGNOSIS — K047 Periapical abscess without sinus: Secondary | ICD-10-CM | POA: Insufficient documentation

## 2015-06-26 DIAGNOSIS — E039 Hypothyroidism, unspecified: Secondary | ICD-10-CM | POA: Insufficient documentation

## 2015-06-26 DIAGNOSIS — Z87440 Personal history of urinary (tract) infections: Secondary | ICD-10-CM | POA: Insufficient documentation

## 2015-06-26 DIAGNOSIS — F17210 Nicotine dependence, cigarettes, uncomplicated: Secondary | ICD-10-CM | POA: Insufficient documentation

## 2015-06-26 DIAGNOSIS — L03211 Cellulitis of face: Secondary | ICD-10-CM | POA: Insufficient documentation

## 2015-06-26 LAB — BASIC METABOLIC PANEL
Anion gap: 10 (ref 5–15)
BUN: 9 mg/dL (ref 6–20)
CHLORIDE: 103 mmol/L (ref 101–111)
CO2: 22 mmol/L (ref 22–32)
CREATININE: 1.03 mg/dL — AB (ref 0.44–1.00)
Calcium: 9 mg/dL (ref 8.9–10.3)
GFR calc Af Amer: >60 mL/min (ref >60)
GFR calc non Af Amer: >60 mL/min (ref >60)
GLUCOSE: 102 mg/dL — AB (ref 65–99)
POTASSIUM: 3.6 mmol/L (ref 3.5–5.1)
Sodium: 135 mmol/L (ref 135–145)

## 2015-06-26 LAB — CBC WITH DIFFERENTIAL/PLATELET
Basophils Absolute: 0.1 10*3/uL (ref 0.0–0.1)
Basophils Relative: 1 %
EOS PCT: 4 %
Eosinophils Absolute: 0.4 10*3/uL (ref 0.0–0.7)
HCT: 45.6 % (ref 36.0–46.0)
HEMOGLOBIN: 15.6 g/dL — AB (ref 12.0–15.0)
LYMPHS ABS: 2.4 10*3/uL (ref 0.7–4.0)
Lymphocytes Relative: 25 %
MCH: 31.6 pg (ref 26.0–34.0)
MCHC: 34.2 g/dL (ref 30.0–36.0)
MCV: 92.5 fL (ref 78.0–100.0)
MONOS PCT: 6 %
Monocytes Absolute: 0.6 10*3/uL (ref 0.1–1.0)
NEUTROS PCT: 64 %
Neutro Abs: 6.1 10*3/uL (ref 1.7–7.7)
Platelets: 214 10*3/uL (ref 150–400)
RBC: 4.93 MIL/uL (ref 3.87–5.11)
RDW: 13.6 % (ref 11.5–15.5)
WBC: 9.6 10*3/uL (ref 4.0–10.5)

## 2015-06-26 MED ORDER — CLINDAMYCIN PHOSPHATE 600 MG/50ML IV SOLN
600.0000 mg | Freq: Once | INTRAVENOUS | Status: AC
  Administered 2015-06-26: 600 mg via INTRAVENOUS
  Filled 2015-06-26: qty 50

## 2015-06-26 MED ORDER — MORPHINE SULFATE (PF) 4 MG/ML IV SOLN
4.0000 mg | Freq: Once | INTRAVENOUS | Status: AC
  Administered 2015-06-26: 4 mg via INTRAVENOUS
  Filled 2015-06-26: qty 1

## 2015-06-26 MED ORDER — ONDANSETRON HCL 4 MG/2ML IJ SOLN
4.0000 mg | Freq: Once | INTRAMUSCULAR | Status: AC
  Administered 2015-06-26: 4 mg via INTRAVENOUS
  Filled 2015-06-26: qty 2

## 2015-06-26 MED ORDER — IOHEXOL 300 MG/ML  SOLN
75.0000 mL | Freq: Once | INTRAMUSCULAR | Status: AC | PRN
Start: 2015-06-26 — End: 2015-06-26
  Administered 2015-06-26: 75 mL via INTRAVENOUS

## 2015-06-26 MED ORDER — OXYCODONE-ACETAMINOPHEN 5-325 MG PO TABS
2.0000 | ORAL_TABLET | Freq: Once | ORAL | Status: AC
Start: 2015-06-26 — End: 2015-06-26
  Administered 2015-06-26: 2 via ORAL
  Filled 2015-06-26: qty 2

## 2015-06-26 NOTE — ED Notes (Signed)
CALLED [facility] FOR TRANSFER

## 2015-06-26 NOTE — ED Notes (Signed)
Pt arrives with facial swelling at left  lower jaw since Tuesday. Pt has had Augmentin and injected abx without improvement.

## 2015-06-26 NOTE — Discharge Instructions (Signed)

## 2015-06-26 NOTE — ED Provider Notes (Addendum)
CSN: [id number]     Arrival date & time 06/26/15  8295 History   First MD Initiated Contact with Patient 06/26/15 [phone]     Chief Complaint  Patient presents with  . Facial Swelling   HPI  Maria Robles is a 49 y.o. F PMH significant for left sided facial swelling and pain since Tuesday. She has been seen at her PCP almost daily for her symptoms. Her PCP gave her Augmentin (she has taken 6 tablets but none this morning) and Vicodin, which has provided moderate relief. She describes her pain as 6/10 pain scale, constant, aching, and radiating from her face up to her jaw and down her neck periodically. She endorses "low grade fever" and nausea. No CP, abdominal pain, emesis, changes in bowel/bladder habits.   Broke back molar one year ago.   Past Medical History  Diagnosis Date  . Hypothyroidism   . Chronic UTI     hx--none since did the prophylatic Cipro for 1 year  . Granuloma annulare 2016   Past Surgical History  Procedure Laterality Date  . Benign tumor      R shoulder   . Tubal ligation  1993  . Appendectomy  2008   Family History  Problem Relation Age of Onset  . Diabetes Neg Hx   . Heart attack Neg Hx   . Hypertension Neg Hx   . Coronary artery disease Neg Hx   . Stroke Neg Hx   . Cancer Neg Hx     breast or colon    Social History  Substance Use Topics  . Smoking status: Current Every Day Smoker -- 0.20 packs/day for 0 years    Types: Cigarettes  . Smokeless tobacco: Never Used     Comment: 1 pack per 2 weeks  . Alcohol Use: Yes     Comment: occasionally   OB History    No data available     Review of Systems  Ten systems are reviewed and are negative for acute change except as noted in the HPI   Allergies  Sulfa antibiotics  Home Medications   Prior to Admission medications   Medication Sig Start Date End Date Taking? Authorizing Provider  amoxicillin-clavulanate (AUGMENTIN) 875-125 MG tablet Take 1 tablet by mouth 2 (two) times daily. 06/23/15    Julieanne Manson, MD  HYDROcodone-acetaminophen (NORCO/VICODIN) 5-325 MG tablet 1-2 tabs by mouth every 4 hours as needed for jaw pain 06/24/15   Julieanne Manson, MD  levothyroxine (SYNTHROID, LEVOTHROID) 150 MCG tablet Take 1 tablet (150 mcg total) by mouth daily before breakfast. 04/15/14   Ladona Mow, PA-C   BP 132/83 mmHg  Pulse 60  Temp(Src) 98 F (36.7 C) (Oral)  Resp 16  Ht  (1.803 m)  Wt 252 lb (114.306 kg)  BMI 35.16 kg/m2  SpO2 96%  LMP 06/22/2015 (Exact Date) Physical Exam  Constitutional: She appears well-developed and well-nourished. No distress.  HENT:  Head: Normocephalic and atraumatic.  Right Ear: External ear normal.  Left Ear: External ear normal.  Nose: Nose normal.  Mouth/Throat: Oropharynx is clear and moist. No oropharyngeal exudate.  Left sided facial swelling with area of induration. No drainage, erythema.   Eyes: Conjunctivae are normal. Pupils are equal, round, and reactive to light. Right eye exhibits no discharge. Left eye exhibits no discharge. No scleral icterus.  Neck: No tracheal deviation present.  Cardiovascular: Normal rate, regular rhythm, normal heart sounds and intact distal pulses.  Exam reveals no gallop and no friction  rub.   No murmur heard. Pulmonary/Chest: Effort normal and breath sounds normal. No respiratory distress. She has no wheezes. She has no rales. She exhibits no tenderness.  Abdominal: Soft. Bowel sounds are normal. She exhibits no distension and no mass. There is no tenderness. There is no rebound and no guarding.  Musculoskeletal: She exhibits no edema.  Lymphadenopathy:    She has no cervical adenopathy.  Neurological: She is alert. Coordination normal.  Skin: Skin is warm and dry. No rash noted. She is not diaphoretic. No erythema.  Psychiatric: She has a normal mood and affect. Her behavior is normal.  Nursing note and vitals reviewed.   ED Course  Procedures (including critical care time) Labs Review Labs  Reviewed  CBC WITH DIFFERENTIAL/PLATELET - Abnormal; Notable for the following:    Hemoglobin 15.6 (*)    All other components within normal limits  BASIC METABOLIC PANEL - Abnormal; Notable for the following:    Glucose, Bld 102 (*)    Creatinine, Ser 1.03 (*)    All other components within normal limits    Imaging Review Ct Soft Tissue Neck W Contrast  06/26/2015  CLINICAL DATA:  49 year old female with a history of swelling of the neck and jaw for 1 week. Low-grade fevers. EXAM: CT NECK WITH CONTRAST TECHNIQUE: Multidetector CT imaging of the neck was performed using the standard protocol following the bolus administration of intravenous contrast. CONTRAST:  75mL OMNIPAQUE IOHEXOL 300 MG/ML  SOLN COMPARISON:  Head CT 04/15/2014 FINDINGS: Skull base: Visualized intracranial structures unremarkable. No paranasal sinus disease.  Mastoid air cells clear. No displaced skull base fracture. Orbits: Unremarkable appearance of the orbits and globes. Cervical: No fracture of the cervical spine. No significant degenerative changes or bony canal narrowing. Craniocervical junction unremarkable. Vasculature: Unremarkable appearance of the visualized aortic arch. Branch vessels patent proximally. Bilateral ICA patent. Bilateral vertebral arteries patent. Bilateral internal jugular veins patent. Unremarkable appearance of the facial veins. Soft tissues: Asymmetric soft tissues of the face, with infiltrative density in the left submandibular region involving the platysma. Platysma is thickened along the left submandibular region and neck, with reactive lymph node in the left submandibular region. Mandible: No mandibular fracture. Periapical lucency of the left mandibular molar teeth with evidence of dental caries of the left mandibular teeth. Rim enhancing low-density collection along the medial mandibular body, adjacent to the diseased mandibular molars teeth. Coronal reformatted images demonstrate greatest diameter  of 17 mm. Greatest AP diameter on axial source images measures approximately 15 mm. Inflammatory changes contribute to minimal mass effect on the adjacent floor of mouth musculature. Hypopharynx tissues unremarkable with unremarkable appearance of the epiglottis. Unremarkable appearance of the vocal cords and laryngeal cartilage. Unremarkable appearance of the thoracic inlet with no supraclavicular adenopathy. Unremarkable thyroid. Bilateral cervical/jugular lymph nodes. No displaced fracture. Degenerative changes of the cervical spine without bony canal narrowing. Upper thorax: Geographic ground-glass of the bilateral lung apices, potentially atelectasis or developing emphysema. Correlation with prior history of smoking may be useful. IMPRESSION: Subperiosteal abscess along the medial left mandibular body, with odontogenic etiology most likely given the adjacent periapical lucencies and dental caries of the mandibular teeth. Greatest diameter in AP measures 15 mm in greatest diameter in coronal reformat is 17 mm. There is associated soft tissue swelling, thickening of the platysma, and reactive lymph nodes. These results were called by telephone at the time of interpretation on 06/26/2015 at 1:36 pm to Dr. Radford Pax, who verbally acknowledged these results. Signed, Yvone Neu. Loreta Ave, DO  [facility] Electronically Signed   By: Gilmer MorJaime  Wagner D.O.   On: 06/26/2015 13:36   I have personally reviewed and evaluated these images and lab results as part of my medical decision-making.   EKG Interpretation None      MDM   Final diagnoses:  Dental abscess  Cellulitis of face   Patient non-toxic appearing and VSS. Significant swelling on left side of face. Will CT to evaluate for abscess.   Basic labs unremarkable except for slightly elevated creatinine.  CT demonstrating subperiosteal abscess. Patient started on Clindamycin. Unfortunately, no consultants  available here. [facility]. Dr. Radford PaxBeaton completed EMTALA paperwork and [facility].  Patient may be safely transferred. Patient in understanding and agreement with the plan.   Melton KrebsSamantha Nicole Evalena Fujii, PA-C 07/08/15 2125  Nelva Nayobert Beaton, MD 07/11/15 2237  Melton KrebsSamantha Nicole Aiden Rao, PA-C 07/20/15 [id number]  Nelva Nayobert Beaton, MD 07/20/15 [phone]

## 2015-06-27 ENCOUNTER — Telehealth: Payer: Self-pay | Admitting: Internal Medicine

## 2015-06-27 NOTE — Telephone Encounter (Signed)
Patient called and was admitted [facility].  Tooth was extracted, given antibiotics.  Feeling better and was released from hospital today.  Going to pick up Rx.  Patient going to call MAP program to see if she can pick up Rx.  If not she will call Dr. Delrae AlfredMulberry in the am on Tuesday, 06/28/2015.

## 2015-08-16 ENCOUNTER — Ambulatory Visit (INDEPENDENT_AMBULATORY_CARE_PROVIDER_SITE_OTHER): Payer: Self-pay | Admitting: Internal Medicine

## 2015-08-16 ENCOUNTER — Encounter: Payer: Self-pay | Admitting: Internal Medicine

## 2015-08-16 VITALS — BP 146/90 | HR 76 | Ht 69.0 in | Wt 243.0 lb

## 2015-08-16 DIAGNOSIS — Z124 Encounter for screening for malignant neoplasm of cervix: Secondary | ICD-10-CM

## 2015-08-16 DIAGNOSIS — H547 Unspecified visual loss: Secondary | ICD-10-CM

## 2015-08-16 DIAGNOSIS — Z1239 Encounter for other screening for malignant neoplasm of breast: Secondary | ICD-10-CM

## 2015-08-16 DIAGNOSIS — Z113 Encounter for screening for infections with a predominantly sexual mode of transmission: Secondary | ICD-10-CM

## 2015-08-16 LAB — POCT WET PREP WITH KOH
Clue Cells Wet Prep HPF POC: POSITIVE
KOH Prep POC: NEGATIVE
RBC WET PREP PER HPF POC: NEGATIVE
Trichomonas, UA: NEGATIVE
Yeast Wet Prep HPF POC: NEGATIVE

## 2015-08-16 NOTE — Progress Notes (Signed)
Subjective:    Patient ID: Maria Robles, female    DOB: [birth year], 50 y.o.   MRN: [id number]  HPI  Here for CPE 2017 is a year for her getting healthy. Patient moving out to a healthier living environment.  Health Maintenance:  Colonoscopy 2015 at other facility.  Will need to check records.  Reportedly one polyp and to repeat in 3 years.  Immunizations:  Tdap in 2008.   Meds:   Levothyroxine 150 mcg once daily MVI once daily  Allergies  Allergen Reactions  . Sulfa Antibiotics       Review of Systems  Constitutional:       Good energy. Getting back to work will be good for her.  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    Eyes: Negative for redness.       Right eye pressure with blurriness for past few weeks.  Tenderness when rubbed.  No light halo at night.   Respiratory: Negative for shortness of breath.   Cardiovascular: Negative for chest pain, palpitations and leg swelling.  Gastrointestinal: Negative for nausea, abdominal pain and blood in stool.       No melena  Endocrine: Negative for cold intolerance and heat intolerance.  Genitourinary: Negative for dysuria, frequency and vaginal bleeding.       LMP last week of October.  Periods every 3-4 months for past 2 years.   No hot flashes or night sweats any longer. Uses Astroglide with good success for intercourse  Musculoskeletal: Negative for joint swelling, arthralgias and gait problem.  Allergic/Immunologic: Negative for food allergies.  Neurological: Negative for dizziness, tremors and weakness.       Residual numbness on top of left foot since restarting thyroid hormone therapy   Hematological: Negative for adenopathy.  Psychiatric/Behavioral: Negative for suicidal ideas and agitation.       Objective:   Physical Exam  Constitutional: She is oriented to person, place, and time.  Obese, NAD  HENT:  Head: Normocephalic.  Right Ear: External ear normal.  Left  Ear: External ear normal.  Nose: Nose normal.  Mouth/Throat: Oropharynx is clear and moist.  Eyes: EOM are normal. Pupils are equal, round, and reactive to light.  Discs sharp with no AV nicking bilaterally  Neck: Normal range of motion. Neck supple. No thyroid mass and no thyromegaly present.  Cardiovascular: Normal rate, regular rhythm, S1 normal, S2 normal and normal pulses.  Exam reveals no S3, no S4 and no friction rub.   No murmur heard. Pulmonary/Chest: Effort normal and breath sounds normal. Right breast exhibits no inverted nipple, no mass, no nipple discharge, no skin change and no tenderness. Left breast exhibits no inverted nipple, no mass, no nipple discharge, no skin change and no tenderness.  Abdominal: Soft. Bowel sounds are normal. She exhibits no mass. There is no hepatosplenomegaly. There is no tenderness. No hernia. Hernia confirmed negative in the right inguinal area and confirmed negative in the left inguinal area.  Genitourinary: Rectum normal and vagina normal. Rectal exam shows no mass. Guaiac negative stool. No vaginal discharge found.   Genitalia: External genitalia normal without lesion.  No vaginal lesions. Scant frothy yellow dishcharge.  Vaginal mucosa moist. Cervix without lesion.  Pap taken.  No CMT.  No uterine or adnexal mass or tenderness.   Musculoskeletal: Normal range of motion.  Lymphadenopathy:    She has no cervical adenopathy.    She has no axillary adenopathy.  Right: No inguinal adenopathy present.       Left: No inguinal adenopathy present.  Neurological: She is alert and oriented to person, place, and time. She has normal strength and normal reflexes. No cranial nerve deficit or sensory deficit. Coordination and gait normal.  Skin: Skin is warm.  Pink elevated rings with central clearing on dorsum of hands--chronic  Psychiatric: She has a normal mood and affect. Her speech is normal and behavior is normal. Judgment and thought content normal.           Assessment & Plan:  1.  Well Adult:  Pap, STD work up with GC/Chlamydia today and will check RPR and HIV when returns for fasting labs in next 2 weeks. Screening mammogram. Tdap up to date   2.  Recent external, possibly internal hemorrhoid:  During exam, pt. Admitted to recent bleeding hemorrhoid (past 24 hours)  Stool heme positive in some areas today, likely due to this.  Will start fiber laxative daily and repeat 3 hemoccult card check once hemorrhoid healed and return to clinic.  3.  Bacterial vaginosis:  Pt. Recently on at least 3 courses of antibiotics for abscessed tooth, requiring tooth extraction and drainage.  Asymptomatic with BV.  No treatment for now.  4.  Decreased vision in right eye:  Referral to ophthalmologist.  5.  Obesity:  To work on improving diet and physical activity.  Information given.

## 2015-08-16 NOTE — Patient Instructions (Signed)
Can google "advance directives, [place]"  And bring up form from Secretary of State. Print and fill out Or can go to "5 wishes"  Which is also in Spanish and fill out--this costs $5--perhaps easier to use. Designate a Medical Power of Attorney to speak for you if you are unable to speak for yourself when ill or injured  Drink a glass of water before every meal Drink 6-8 glasses of water daily Eat three meals daily Eat a protein and healthy fat with every meal (eggs,fish, chicken, turkey and limit red meats) Eat 5 servings of vegetables daily, mix the colors Eat 2 servings of fruit daily with skin, if skin is edible Use smaller plates Put food/utensils down as you chew and swallow each bite Eat at a table with friends/family at least once daily, no TV Do not eat in front of the TV 

## 2015-08-17 ENCOUNTER — Other Ambulatory Visit: Payer: Self-pay | Admitting: Internal Medicine

## 2015-08-17 DIAGNOSIS — Z1231 Encounter for screening mammogram for malignant neoplasm of breast: Secondary | ICD-10-CM

## 2015-08-17 LAB — GC/CHLAMYDIA PROBE AMP
Chlamydia trachomatis, NAA: NEGATIVE
NEISSERIA GONORRHOEAE BY PCR: NEGATIVE

## 2015-08-18 LAB — CYTOLOGY - PAP

## 2015-08-19 ENCOUNTER — Other Ambulatory Visit: Payer: Self-pay

## 2015-08-24 ENCOUNTER — Ambulatory Visit
Admission: RE | Admit: 2015-08-24 | Discharge: 2015-08-24 | Disposition: A | Discharge status: Home / Self Care | Payer: No Typology Code available for payment source | Source: Ambulatory Visit | Attending: Internal Medicine | Admitting: Internal Medicine

## 2015-08-24 DIAGNOSIS — Z1231 Encounter for screening mammogram for malignant neoplasm of breast: Secondary | ICD-10-CM

## 2015-08-31 ENCOUNTER — Ambulatory Visit: Payer: Self-pay | Admitting: Licensed Clinical Social Worker

## 2015-08-31 DIAGNOSIS — F439 Reaction to severe stress, unspecified: Secondary | ICD-10-CM

## 2015-08-31 NOTE — Progress Notes (Signed)
   THERAPY PROGRESS NOTE  Session Time: 60 minutes  Participation Level: Active  Behavioral Response: Well GroomedAlertEuthymic  Type of Therapy: Individual Therapy  Treatment Goals addressed: Diagnosis: Pending  Interventions: Supportive  Summary: Maria Robles is a 50 y.o. female who presents with euthymic mood and appropriate affect. Maria Robles reported that her home situation is very toxic to her and that she is planning on moving at the end of the month. Maria Robles reported that she felt like her roommate is manipulative and that she is not acting like an adult. Maria Robles reported that she has seen different addiction in her life, and knows that her roommate is addicted to gambling. Maria Robles reported that she feels there is no communication in her home now, and that the boundaries that were set are not being followed. Maria Robles reported that she is going to be moving at the end of the month, but still is going to help her roommate out some since the roommate helped her when she needed it. She reported that she is happy about the move and sees a "bright future". Maria Robles reported that she would like to be in touch with her inner self more and that she would like to do what makes her happy. Maria Robles reported that she has five children that are all grown up and moved out of the house. Maria Robles reported that she has a brother and sister, both that she gets along with. She also reported that her mother and her partner live in [place] and that she has a good relationship with her mother. Maria Robles reported that she was raised by her mother and that her father passed away when she was [age] years old. She also reported that her family believes that her father committed suicide. Maria Robles reported that she had a lot of family support during her childhood and that it helped. Maria Robles reported that her mom did get remarried to a man and that she still talks with him, even though her mom got a  divorce. Maria Robles reported that she has been married and divorced five years ago. The marriage lasted for 10 years. She reported that they still talk and they are "best friends". Maria Robles has many goals including losing weight, learning about herself more in 2017, and getting more education. Maria Robles reported that she would like to come to counseling because she would like to have support for her life decisions and get in a good place in her life.    Suicidal/Homicidal: NAwithout intent/plan  Therapist Response: Social Work Tax inspector greeted [employer]. SWI used active listening when Leilana was telling her why she wanted counseling and what her situation is now. SWI used empathy when Darbie reported some of her past and her situation now. SWI used open ended questions when getting information about Merridith's situation. SWI was not able to complete the assessment due to time constraints.   Plan: Return again in one week.  Diagnosis: Axis I: Pending    Axis II: No diagnosis    Hewitt Shorts, Student-SW 08/31/2015

## 2015-09-01 ENCOUNTER — Other Ambulatory Visit: Payer: Self-pay

## 2015-09-02 LAB — LIPID PANEL W/O CHOL/HDL RATIO
Cholesterol, Total: 222 mg/dL — ABNORMAL HIGH (ref 100–199)
HDL: 44 mg/dL (ref >39)
LDL Calculated: 133 mg/dL — ABNORMAL HIGH (ref 0–99)
TRIGLYCERIDES: 226 mg/dL — AB (ref 0–149)
VLDL CHOLESTEROL CAL: 45 mg/dL — AB (ref 5–40)

## 2015-09-02 LAB — COMPREHENSIVE METABOLIC PANEL
ALBUMIN: 3.6 g/dL (ref 3.5–5.5)
ALT: 17 IU/L (ref 0–32)
AST: 16 IU/L (ref 0–40)
Albumin/Globulin Ratio: 1.6 (ref 1.1–2.5)
Alkaline Phosphatase: 68 IU/L (ref 39–117)
BUN/Creatinine Ratio: 19 (ref 9–23)
BUN: 17 mg/dL (ref 6–24)
Bilirubin Total: 0.3 mg/dL (ref 0.0–1.2)
CALCIUM: 8.8 mg/dL (ref 8.7–10.2)
CO2: 24 mmol/L (ref 18–29)
Chloride: 105 mmol/L (ref 96–106)
Creatinine, Ser: 0.88 mg/dL (ref 0.57–1.00)
GFR calc Af Amer: 87 mL/min/{1.73_m2} (ref >59)
GFR, EST NON AFRICAN AMERICAN: 76 mL/min/{1.73_m2} (ref >59)
GLOBULIN, TOTAL: 2.2 g/dL (ref 1.5–4.5)
GLUCOSE: 92 mg/dL (ref 65–99)
Potassium: 4.7 mmol/L (ref 3.5–5.2)
SODIUM: 144 mmol/L (ref 134–144)
Total Protein: 5.8 g/dL — ABNORMAL LOW (ref 6.0–8.5)

## 2015-09-02 LAB — RPR QUALITATIVE: RPR Ser Ql: NONREACTIVE

## 2015-09-02 LAB — HIV ANTIBODY (ROUTINE TESTING W REFLEX): HIV Screen 4th Generation wRfx: NONREACTIVE

## 2015-09-02 LAB — TSH: TSH: 20.72 u[IU]/mL — AB (ref 0.450–4.500)

## 2015-09-07 ENCOUNTER — Other Ambulatory Visit: Payer: No Typology Code available for payment source | Admitting: Licensed Clinical Social Worker

## 2015-09-14 ENCOUNTER — Other Ambulatory Visit: Payer: No Typology Code available for payment source | Admitting: Licensed Clinical Social Worker

## 2015-09-26 ENCOUNTER — Ambulatory Visit: Payer: No Typology Code available for payment source | Admitting: Internal Medicine

## 2016-03-19 ENCOUNTER — Other Ambulatory Visit: Payer: Self-pay | Admitting: Internal Medicine

## 2016-03-19 DIAGNOSIS — E039 Hypothyroidism, unspecified: Secondary | ICD-10-CM

## 2016-03-20 ENCOUNTER — Other Ambulatory Visit: Payer: Self-pay | Admitting: Internal Medicine

## 2016-03-20 DIAGNOSIS — E039 Hypothyroidism, unspecified: Secondary | ICD-10-CM

## 2016-04-27 ENCOUNTER — Encounter: Payer: Self-pay | Admitting: Internal Medicine

## 2016-04-27 ENCOUNTER — Ambulatory Visit (INDEPENDENT_AMBULATORY_CARE_PROVIDER_SITE_OTHER): Payer: Self-pay | Admitting: Internal Medicine

## 2016-04-27 VITALS — BP 170/110 | HR 82 | Resp 14 | Ht 68.0 in | Wt 252.0 lb

## 2016-04-27 DIAGNOSIS — E039 Hypothyroidism, unspecified: Secondary | ICD-10-CM

## 2016-04-27 DIAGNOSIS — I10 Essential (primary) hypertension: Secondary | ICD-10-CM

## 2016-04-27 DIAGNOSIS — E785 Hyperlipidemia, unspecified: Secondary | ICD-10-CM

## 2016-04-27 DIAGNOSIS — Z720 Tobacco use: Secondary | ICD-10-CM

## 2016-04-27 MED ORDER — LEVOTHYROXINE SODIUM 150 MCG PO TABS: ORAL_TABLET | ORAL | 6 refills | Status: DC

## 2016-04-27 MED ORDER — METOPROLOL TARTRATE 25 MG PO TABS: 25.0000 mg | ORAL_TABLET | Freq: Two times a day (BID) | ORAL | 11 refills | Status: DC

## 2016-04-27 NOTE — Progress Notes (Signed)
   Subjective:    Patient ID: Maria Robles, female    DOB: 1966-05-31, 50 y.o.   MRN: [id number]  HPI   Here as she needs meds refilled before transferring care now that she will have insurance and no longer lives in the area.  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  1.  Elevated BP:  Have been discussing treatment for some time.  She is finally willing to consider medication.  2.  Hypothyroidism:  TSH was above 20.  Was out of levothyroxine for 3 weeks through July and then called for refill in August.   On for no more than 3 weeks.  Have never seen her TSH in a good range due to intermittent discontinuation before testing.  3.  Hyperlipidemia: Just following up from January on this. Lipid Panel     Component Value Date/Time   CHOL 222 (H) 09/01/2015 0915   TRIG 226 (H) 09/01/2015 0915   HDL 44 09/01/2015 0915   LDLCALC 133 (H) 09/01/2015 0915    Current Meds  Medication Sig  . levothyroxine (SYNTHROID, LEVOTHROID) 150 MCG tablet 1 tab by mouth daily  . Multiple Vitamins-Minerals (MULTIVITAMIN WOMEN) TABS Take 1 tablet by mouth daily.  . [DISCONTINUED] levothyroxine (SYNTHROID, LEVOTHROID) 150 MCG tablet TAKE ONE TABLET BY MOUTH EVERY DAY BEFORE MEALS   Allergies  Allergen Reactions  . Sulfa Antibiotics        Review of Systems     Objective:   Physical Exam Mild smell of tobacco smoke Lungs:  CTA CV:  RRR without murmur or rub, radial pulses normal and equal.  DP pulses normal and equal Legs:  No edema       Assessment & Plan:  1.  Essential Hypertension:  Metoprolol 25 mg twice daily.  Discussed once her insurance kicks in, she can get the succinate version and take 50 mg once daily.  BP and pulse check in 2 weeks.   Get established with another physician.  2.  Hypothyroidism:  Needs to be consistent with taking for at least 6 weeks and then have TSH checked with new physician.  To take first thing in morning with water only at least 1/2 hour before her  morning coffee.  3.  Hyperlipidemia:  Discussed keeping her thyroid adequately supplemented, weight loss and continuing with liifestyle changes with eating and physical activity will help with this. Concerned she is developing metabolic  syndrome due to weight gain.  4.  Tobacco Use:  Only smoking3-4 cigarettes daily.  Cannot smoke in her current home or at work.  Encouraged trial of nicotine gum or starting with 14 mg patches.

## 2016-04-27 NOTE — Patient Instructions (Signed)
For Nicotine gum:  When you feel need for smoking:  Place nicotine gum in mouth and chew until juicy, then stick between gum and cheek.  You will absorb the nicotine from your mouth.   Do not aggressively chew gum. Spit gum out in garbage once you feel you are done with it.For nicotine patches:  Stop smoking anything the day you start the first patch Start with 14 mg patch and reapply new to different area of skin every 24 hours for 30 days. Then 7 mg patch changed every 24 hours for 14 days.  Get a new physician and make sure you get your TSH checked after being on Levothyroxine continuously for at least 6 weeks to see if your dosing is okay  Much luck to you in your new life!!!!

## 2016-05-09 ENCOUNTER — Encounter: Payer: Self-pay | Admitting: Internal Medicine

## 2016-06-08 ENCOUNTER — Encounter: Payer: Self-pay | Admitting: Internal Medicine

## 2016-10-01 NOTE — Addendum Note (Signed)
Addended by: Nilda SimmerKNIGHT, Deaunte Dente on: 10/01/2016 03:46 PM   Modules accepted: Level of Service

## 2017-12-04 ENCOUNTER — Other Ambulatory Visit (INDEPENDENT_AMBULATORY_CARE_PROVIDER_SITE_OTHER): Payer: BLUE CROSS/BLUE SHIELD | Admitting: Internal Medicine

## 2017-12-04 DIAGNOSIS — R350 Frequency of micturition: Secondary | ICD-10-CM | POA: Diagnosis not present

## 2017-12-04 LAB — POCT URINALYSIS DIPSTICK
Glucose, UA: NEGATIVE
KETONES UA: NEGATIVE
NITRITE UA: POSITIVE
PH UA: 6.5 (ref 5.0–8.0)
SPEC GRAV UA: 1.015 (ref 1.010–1.025)
UROBILINOGEN UA: 0.2 U/dL

## 2017-12-04 MED ORDER — CIPROFLOXACIN HCL 500 MG PO TABS: 500.0000 mg | ORAL_TABLET | Freq: Two times a day (BID) | ORAL | 0 refills | Status: AC

## 2017-12-06 LAB — URINE CULTURE

## 2017-12-24 ENCOUNTER — Encounter: Payer: Self-pay | Admitting: Internal Medicine

## 2017-12-24 ENCOUNTER — Ambulatory Visit: Payer: BLUE CROSS/BLUE SHIELD | Admitting: Internal Medicine

## 2017-12-24 VITALS — BP 124/82 | HR 70 | Resp 12 | Ht 68.0 in | Wt 281.0 lb

## 2017-12-24 DIAGNOSIS — I10 Essential (primary) hypertension: Secondary | ICD-10-CM | POA: Diagnosis not present

## 2017-12-24 DIAGNOSIS — Z1211 Encounter for screening for malignant neoplasm of colon: Secondary | ICD-10-CM | POA: Diagnosis not present

## 2017-12-24 DIAGNOSIS — Z6841 Body Mass Index (BMI) 40.0 and over, adult: Secondary | ICD-10-CM

## 2017-12-24 DIAGNOSIS — Z1231 Encounter for screening mammogram for malignant neoplasm of breast: Secondary | ICD-10-CM | POA: Diagnosis not present

## 2017-12-24 DIAGNOSIS — E039 Hypothyroidism, unspecified: Secondary | ICD-10-CM

## 2017-12-24 DIAGNOSIS — Z1239 Encounter for other screening for malignant neoplasm of breast: Secondary | ICD-10-CM

## 2017-12-24 MED ORDER — LEVOTHYROXINE SODIUM 150 MCG PO TABS: ORAL_TABLET | ORAL | 3 refills | Status: DC

## 2017-12-24 NOTE — Progress Notes (Signed)
Subjective:    Patient ID: Maria Robles, female    DOB: [birth year], 52 y.o.   MRN: [id number]  HPI  Here to re-establish  1.  Hypothyroidism:  Ran out of Levothyroxine in February.  Feels tired, eyes swollen, weight gain.    2.  History of Essential Hypertension:  When a patient here previously, was living in a high stress situation with living situation, drinking too much, not eating correctly.  Since moving out of that situation, her bp has been fine without her Metoprolol for about 1.5 years.  BP is fine today.  3.  Morbid obesity:  Living with her "daughter" and daughter's family while awaiting an apartment she is waiting to be redone.  Plans to get started on lifestyle changes with diet and physical activity.  4.  HM:  Due for Td, which is not available here.  Colonoscopy:  Last colonoscopy, she believes was in 2009 with  [facility].  Unable to find in Care Everywhere.  She states she had a small polyp.  She states she was unaware of any problem that led to her having the colonoscopy then.  May have had one polyp that was benign.  Mammogram:  08/24/2015  Pap:  08/16/2015  Hep C:  Not done yet.  Current Meds  Medication Sig  . Multiple Vitamins-Minerals (MULTIVITAMIN WOMEN) TABS Take 1 tablet by mouth daily.    Allergies  Allergen Reactions  . Sulfa Antibiotics    Past Medical History:  Diagnosis Date  . Essential hypertension   . 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  . Hypothyroidism 1997  . Recurrent UTI 1990s    Past Surgical History:  Procedure Laterality Date  . APPENDECTOMY  2008  . benign tumor Right Late 1990s   R shoulder   . left tooth extraction Left 06/2015   with associated abscess  . TUBAL LIGATION  1993    Family History  Problem Relation Age of Onset  . Tremor Mother        Familial essential tremor  . GER disease Mother   . Arrhythmia Mother        History of what sounds like palpitations, but no findings on monitor  . Depression  Mother        On low dose Prozac since patient a young girl  . Asthma Sister   . Drug abuse Son   . Diabetes Neg Hx   . Heart attack Neg Hx   . Hypertension Neg Hx   . Coronary artery disease Neg Hx   . Stroke Neg Hx   . Cancer Neg Hx        breast or colon                Review of Systems     Objective:   Physical Exam NAD HEENT:  Eyelids swollen, face doughy,  No obvious loss of hair.  Throat without injection. Neck: Supple, No adenopathy.  No definite thyromegaly Chest:  CTA CV:  RRR with normal S1 and S2 No S3, S4 or murmur.  Radial and DP pulses normal and equal Abd:  S, NT, No HSM or mass, No flank tenderness. LE:  No edema Skin:  dry       Assessment & Plan:  1.  Hypothyroidism:  Has never been adequately replaced by TSH since at least 2015.  Restart Levothyroxine at 75 mcg daily for 7 days, then up to 150 mcg daily as long as tolerates TSH  in 6 weeks.  2.  History of hypertension:  Fine currently without medication.  3.  Morbid obesity:  Replace T4.  Work on lifestyle changes with diet and physical activity as feels more energy.  4.  HM:  Hep C screen in 6 weeks.  Will check other fasting labs once find her adequately replaced for thyroid. Mammogram Colonoscopy referrals Td at pharmacy or PHD. Pap in 2020

## 2017-12-24 NOTE — Patient Instructions (Signed)

## 2018-01-01 ENCOUNTER — Ambulatory Visit: Payer: Self-pay | Admitting: Licensed Clinical Social Worker

## 2018-01-01 DIAGNOSIS — F439 Reaction to severe stress, unspecified: Secondary | ICD-10-CM

## 2018-01-03 NOTE — Progress Notes (Signed)
   THERAPY PROGRESS NOTE  Session Time:  Participation Level: Active  Behavioral Response: CasualAlertDepressed  Type of Therapy: Individual Therapy  Treatment Goals addressed: Coping  Interventions: Motivational Interviewing and Supportive  Summary: Maria Robles is a 52 y.o. female who presents with a slightly depressed mood and appropriate affect. She reported that she is seeking counseling due to stress around her addict son and her commitment to learning about herself for the first time. She shared that her son has been using heroin for many years and that it has caused chaos and heartbreak in her life over and over. She shared that he is now in jail and may be going to prison long-term, which is something of a relief to her. Maria Robles stated that she is moving into her own apartment in July and is simultaneously excited and terrified for the Maria chapter in her life. She shared that her entire life has been about taking care of others and she feels that she doesn't even know who she is. She stated that she is hopeful about the role of counseling in helping to clarify her direction.   Suicidal/Homicidal: Nowithout intent/plan  Therapist Response: LCSW began the clinical assessment but was unable to finish due to time constraints. LCSW utilized supportive counseling techniques throughout the session in order to validate emotions and encourage open expression of emotion. LCSW provided affirmations to Maria Robles for taking steps in her self-care and healing.  Plan: Return again in 2 weeks.   Nilda Simmer, LCSW 01/03/2018

## 2018-01-06 ENCOUNTER — Ambulatory Visit: Payer: BLUE CROSS/BLUE SHIELD | Admitting: Internal Medicine

## 2018-01-08 NOTE — Progress Notes (Signed)
Patient scheduled to see Dr. Charlott RakesVincent Schooler on 01/30/18 @ 11:15 am. Patient to arrive by 11 am. patietn informed and OV notes, demographics and insurance info faxed to facility.

## 2018-01-09 ENCOUNTER — Other Ambulatory Visit: Payer: BLUE CROSS/BLUE SHIELD | Admitting: Licensed Clinical Social Worker

## 2018-01-17 ENCOUNTER — Other Ambulatory Visit: Payer: BLUE CROSS/BLUE SHIELD | Admitting: Licensed Clinical Social Worker

## 2018-01-29 ENCOUNTER — Other Ambulatory Visit: Payer: BLUE CROSS/BLUE SHIELD | Admitting: Licensed Clinical Social Worker

## 2018-01-30 ENCOUNTER — Telehealth: Payer: Self-pay | Admitting: Internal Medicine

## 2018-01-30 NOTE — Telephone Encounter (Signed)
noted 

## 2018-01-30 NOTE — Telephone Encounter (Signed)
Brooke from [facility] called to let us know the patient had an appointment today with Dr. Bosie ClosSchooler at 11:15 a.m and did not show up to her appointment. Nehemiah SettleBrooke also stated  patient will have to call back to reschedule at [phone].  Cherice for [employer]

## 2018-02-03 ENCOUNTER — Other Ambulatory Visit: Payer: BLUE CROSS/BLUE SHIELD

## 2018-03-21 ENCOUNTER — Ambulatory Visit: Payer: BLUE CROSS/BLUE SHIELD | Admitting: Physician Assistant

## 2018-03-21 ENCOUNTER — Other Ambulatory Visit: Payer: Self-pay

## 2018-03-21 ENCOUNTER — Ambulatory Visit: Payer: Self-pay | Admitting: Physician Assistant

## 2018-03-21 ENCOUNTER — Ambulatory Visit (INDEPENDENT_AMBULATORY_CARE_PROVIDER_SITE_OTHER): Payer: BLUE CROSS/BLUE SHIELD

## 2018-03-21 ENCOUNTER — Encounter: Payer: Self-pay | Admitting: Physician Assistant

## 2018-03-21 VITALS — BP 128/83 | HR 88 | Temp 99.4°F | Resp 20 | Ht 68.9 in | Wt 257.0 lb

## 2018-03-21 DIAGNOSIS — J069 Acute upper respiratory infection, unspecified: Secondary | ICD-10-CM | POA: Diagnosis not present

## 2018-03-21 DIAGNOSIS — R05 Cough: Secondary | ICD-10-CM | POA: Diagnosis not present

## 2018-03-21 DIAGNOSIS — R059 Cough, unspecified: Secondary | ICD-10-CM

## 2018-03-21 DIAGNOSIS — Z720 Tobacco use: Secondary | ICD-10-CM | POA: Diagnosis not present

## 2018-03-21 DIAGNOSIS — H6121 Impacted cerumen, right ear: Secondary | ICD-10-CM

## 2018-03-21 LAB — POCT CBC
Granulocyte percent: 70.9 %G (ref 37–80)
HCT, POC: 43.8 % (ref 37.7–47.9)
Hemoglobin: 15.2 g/dL (ref 12.2–16.2)
Lymph, poc: 1.6 (ref 0.6–3.4)
MCH, POC: 31.5 pg — AB (ref 27–31.2)
MCHC: 34.7 g/dL (ref 31.8–35.4)
MCV: 90.9 fL (ref 80–97)
MID (CBC): 0.5 (ref 0–0.9)
MPV: 8.7 fL (ref 0–99.8)
PLATELET COUNT, POC: 210 10*3/uL (ref 142–424)
POC Granulocyte: 4.9 (ref 2–6.9)
POC LYMPH %: 22.5 % (ref 10–50)
POC MID %: 6.6 %M (ref 0–12)
RBC: 4.82 M/uL (ref 4.04–5.48)
RDW, POC: 14.3 %
WBC: 6.9 10*3/uL (ref 4.6–10.2)

## 2018-03-21 LAB — POC INFLUENZA A&B (BINAX/QUICKVUE)
INFLUENZA A, POC: NEGATIVE
INFLUENZA B, POC: NEGATIVE

## 2018-03-21 MED ORDER — IPRATROPIUM BROMIDE 0.03 % NA SOLN: 2.0000 | Freq: Two times a day (BID) | NASAL | 0 refills | Status: DC

## 2018-03-21 MED ORDER — BENZONATATE 100 MG PO CAPS: 100.0000 mg | ORAL_CAPSULE | Freq: Three times a day (TID) | ORAL | 0 refills | Status: DC | PRN

## 2018-03-21 MED ORDER — HYDROCODONE-HOMATROPINE 5-1.5 MG/5ML PO SYRP: 5.0000 mL | ORAL_SOLUTION | Freq: Three times a day (TID) | ORAL | 0 refills | Status: DC | PRN

## 2018-03-21 NOTE — Progress Notes (Signed)
MRN: [id number] DOB: 02-26-1966  Subjective:   Maria Robles is a 52 y.o. female presenting for chief complaint of Cough (X 3 days) and Nasal Congestion (X 3 days) .  Reports 3 day history of illness. Started out with sudden onset nasal congestion, sneezing, and rhinorrhea. Then developed fever, dry hacking cough, ear fullness, myalgias, and wheezing. Has tried benedryl, which did not help. Has also tried tylenol and ibuprofen.  Denies ear pain, sore throat, shortness of breath and chest pain, nausea, vomiting, abdominal pain and diarrhea. Has not had sick contact with anyone. Has history of mild seasonal allergies, no history of asthma. Patient has not had flu shot this season. Current every day smoker, smokes 4 cigarettes day.  Denies any other aggravating or relieving factors, no other questions or concerns.  Maria Robles has a current medication list which includes the following prescription(s): levothyroxine, multivitamin women, and metoprolol tartrate. Also is allergic to sulfa antibiotics.  Maria Robles  has a past medical history of Essential hypertension, Granuloma annulare (2016), Hypothyroidism (1997), and Recurrent UTI (1990s). Also  has a past surgical history that includes benign tumor (Right, Late 1990s); Appendectomy (2008); Tubal ligation (1993); and left tooth extraction (Left, 06/2015).   Objective:   Vitals: BP 128/83   Pulse 88   Temp 99.4 F (37.4 C) (Oral)   Resp 20   Ht 5' 8.9" (1.75 m)   Wt 257 lb (116.6 kg)   LMP  (LMP Unknown)   SpO2 97%   BMI 38.06 kg/m   Physical Exam  Constitutional: She is oriented to person, place, and time. She appears well-developed and well-nourished.  Appears like she does not feel well sitting on exam table.   HENT:  Head: Normocephalic and atraumatic.  Right Ear: Tympanic membrane, external ear and ear canal normal. No tenderness.  Left Ear: External ear and ear canal normal. No tenderness. Tympanic membrane is erythematous  (mildly erythematous ). Tympanic membrane is not bulging.  Nose: Mucosal edema and rhinorrhea present. Right sinus exhibits maxillary sinus tenderness (mild). Right sinus exhibits no frontal sinus tenderness. Left sinus exhibits maxillary sinus tenderness (mild). Left sinus exhibits no frontal sinus tenderness.  Mouth/Throat: Uvula is midline and mucous membranes are normal. Posterior oropharyngeal erythema present. No posterior oropharyngeal edema or tonsillar abscesses. No tonsillar exudate.  Right ear canal impacted with copious amount of dark brown dry cerumen, unable to visualize TM.   Eyes: Conjunctivae are normal.  Neck: Normal range of motion.  Cardiovascular: Normal rate, regular rhythm, normal heart sounds and intact distal pulses.  Pulses:      Dorsalis pedis pulses are 2+ on the right side, and 2+ on the left side.       Posterior tibial pulses are 2+ on the right side, and 2+ on the left side.  Pulmonary/Chest: Effort normal and breath sounds normal. No accessory muscle usage or stridor. No tachypnea. No respiratory distress. She has no decreased breath sounds. She has no wheezes. She has no rhonchi. She has no rales.  Frequent dry cough noted.   Musculoskeletal:       Right lower leg: She exhibits no swelling.       Left lower leg: She exhibits no swelling.  Lymphadenopathy:       Head (right side): No submental, no submandibular, no tonsillar, no preauricular, no posterior auricular and no occipital adenopathy present.       Head (left side): No submental, no submandibular, no tonsillar, no preauricular, no posterior auricular  and no occipital adenopathy present.    She has no cervical adenopathy.       Right: No supraclavicular adenopathy present.       Left: No supraclavicular adenopathy present.  Neurological: She is alert and oriented to person, place, and time.  Skin: Skin is warm and dry.  Psychiatric: She has a normal mood and affect.  Vitals reviewed.   Results for  orders placed or performed in visit on 03/21/18 (from the past 24 hour(s))  POCT CBC     Status: Abnormal   Collection Time: 03/21/18  3:46 PM  Result Value Ref Range   WBC 6.9 4.6 - 10.2 K/uL   Lymph, poc 1.6 0.6 - 3.4   POC LYMPH PERCENT 22.5 10 - 50 %L   MID (cbc) 0.5 0 - 0.9   POC MID % 6.6 0 - 12 %M   POC Granulocyte 4.9 2 - 6.9   Granulocyte percent 70.9 37 - 80 %G   RBC 4.82 4.04 - 5.48 M/uL   Hemoglobin 15.2 12.2 - 16.2 g/dL   HCT, POC 16.143.8 09.637.7 - 47.9 %   MCV 90.9 80 - 97 fL   MCH, POC 31.5 (A) 27 - 31.2 pg   MCHC 34.7 31.8 - 35.4 g/dL   RDW, POC 04.514.3 %   Platelet Count, POC 210 142 - 424 K/uL   MPV 8.7 0 - 99.8 fL  POC Influenza A&B(BINAX/QUICKVUE)     Status: None   Collection Time: 03/21/18  3:59 PM  Result Value Ref Range   Influenza A, POC Negative Negative   Influenza B, POC Negative Negative   Dg Chest 2 View  Result Date: 03/21/2018 CLINICAL DATA:  Cough for 3 days.  Fever. EXAM: CHEST - 2 VIEW COMPARISON:  04/15/2014 FINDINGS: The heart size and mediastinal contours are within normal limits. Both lungs are clear. No pleural effusion or pneumothorax. The visualized skeletal structures are unremarkable. IMPRESSION: No active cardiopulmonary disease. Electronically Signed   By: Amie Portlandavid  Ormond M.D.   On: 03/21/2018 16:02    Assessment and Plan :  1. Acute upper respiratory infection - Likely viral in etiology d/t reassuring physical exam findings and labs. Lungs CTAB. Plain films with no acute findings.  - Advised supportive care, offered symptomatic relief. - Return to clinic if symptoms worsen or fail to improve in 4-5 days, otherwise return to clinic as needed. - benzonatate (TESSALON) 100 MG capsule; Take 1-2 capsules (100-200 mg total) by mouth 3 (three) times daily as needed for cough.  Dispense: 40 capsule; Refill: 0 - ipratropium (ATROVENT) 0.03 % nasal spray; Place 2 sprays into both nostrils 2 (two) times daily.  Dispense: 30 mL; Refill: 0 -  HYDROcodone-homatropine (HYCODAN) 5-1.5 MG/5ML syrup; Take 5 mLs by mouth every 8 (eight) hours as needed for cough.  Dispense: 75 mL; Refill: 0  2. Cough - POC Influenza A&B(BINAX/QUICKVUE) - POCT CBC - DG Chest 2 View; Future  3. Impacted cerumen of right ear Pt declined ear lavage at this time.   4. Tobacco use Smoking cessation advised.   Benjiman CoreBrittany Emojean Gertz, PA-C  [facility] at [facility] 03/21/2018 4:06 PM

## 2018-03-21 NOTE — Patient Instructions (Addendum)
- Your labs were all completely normal. We will treat this as a respiratory viral infection.  - I recommend you rest, drink plenty of fluids, eat light meals including soups.  -You may use atrovent nasal spray for your runny nose. I recommend over the counter nasal saline rinse for nasal congestion. You may also benefit from antihistamine like zyrtec or dayquil.  - You may use cough syrup at night for your cough and sore throat, Tessalon pearls during the day. Be aware that cough syrup can definitely make you drowsy and sleepy so do not drive or operate any heavy machinery if it is affecting you during the day.  - You may also use Tylenol or ibuprofen over-the-counter for your sore throat. Tea recipe for sore throat: boil water, add 2 inches shaved ginger root, steep 15 minutes, add juice from 2 full lemons, and 2 tbsp honey. - Please let me know if you are not seeing any improvement or get worse in 5-7 days.     Upper Respiratory Infection, Adult Most upper respiratory infections (URIs) are caused by a virus. A URI affects the nose, throat, and upper air passages. The most common type of URI is often called "the common cold." Follow these instructions at home:  Take medicines only as told by your doctor.  Gargle warm saltwater or take cough drops to comfort your throat as told by your doctor.  Use a warm mist humidifier or inhale steam from a shower to increase air moisture. This may make it easier to breathe.  Drink enough fluid to keep your pee (urine) clear or pale yellow.  Eat soups and other clear broths.  Have a healthy diet.  Rest as needed.  Go back to work when your fever is gone or your doctor says it is okay. ? You may need to stay home longer to avoid giving your URI to others. ? You can also wear a face mask and wash your hands often to prevent spread of the virus.  Use your inhaler more if you have asthma.  Do not use any tobacco products, including cigarettes, chewing  tobacco, or electronic cigarettes. If you need help quitting, ask your doctor. Contact a doctor if:  You are getting worse, not better.  Your symptoms are not helped by medicine.  You have chills.  You are getting more short of breath.  You have brown or red mucus.  You have yellow or brown discharge from your nose.  You have pain in your face, especially when you bend forward.  You have a fever.  You have puffy (swollen) neck glands.  You have pain while swallowing.  You have white areas in the back of your throat. Get help right away if:  You have very bad or constant: ? Headache. ? Ear pain. ? Pain in your forehead, behind your eyes, and over your cheekbones (sinus pain). ? Chest pain.  You have long-lasting (chronic) lung disease and any of the following: ? Wheezing. ? Long-lasting cough. ? Coughing up blood. ? A change in your usual mucus.  You have a stiff neck.  You have changes in your: ? Vision. ? Hearing. ? Thinking. ? Mood. This information is not intended to replace advice given to you by your health care provider. Make sure you discuss any questions you have with your health care provider. Document Released: 01/09/2008 Document Revised: 03/25/2016 Document Reviewed: 10/28/2013 Elsevier Interactive Patient Education  2018 [employer]   IF you received an x-ray  today, you will receive an invoice from [facility]. Please contact [facility] at [phone] with questions or concerns regarding your invoice.   IF you received labwork today, you will receive an invoice from [facility]. Please contact [facility] at [phone] with questions or concerns regarding your invoice.   Our billing staff will not be able to assist you with questions regarding bills from these companies.  You will be contacted with the lab results as soon as they are available. The fastest way to get your results is to activate your My Chart account. Instructions  are located on the last page of this paperwork. If you have not heard from us regarding the results in 2 weeks, please contact this office.

## 2018-03-25 ENCOUNTER — Ambulatory Visit: Payer: BLUE CROSS/BLUE SHIELD | Admitting: Internal Medicine

## 2018-03-29 ENCOUNTER — Ambulatory Visit: Payer: BLUE CROSS/BLUE SHIELD | Admitting: Osteopathic Medicine

## 2018-06-23 ENCOUNTER — Other Ambulatory Visit: Payer: Self-pay | Admitting: Infectious Diseases

## 2018-06-23 DIAGNOSIS — N6320 Unspecified lump in the left breast, unspecified quadrant: Secondary | ICD-10-CM

## 2018-06-27 ENCOUNTER — Encounter: Payer: Self-pay | Admitting: Internal Medicine

## 2018-06-30 ENCOUNTER — Ambulatory Visit
Admission: RE | Admit: 2018-06-30 | Discharge: 2018-06-30 | Disposition: A | Discharge status: Home / Self Care | Payer: BLUE CROSS/BLUE SHIELD | Source: Ambulatory Visit | Attending: *Deleted | Admitting: *Deleted

## 2018-06-30 ENCOUNTER — Other Ambulatory Visit: Payer: Self-pay | Admitting: *Deleted

## 2018-06-30 ENCOUNTER — Other Ambulatory Visit: Payer: No Typology Code available for payment source

## 2018-06-30 DIAGNOSIS — N6320 Unspecified lump in the left breast, unspecified quadrant: Secondary | ICD-10-CM

## 2018-07-07 ENCOUNTER — Ambulatory Visit
Admission: RE | Admit: 2018-07-07 | Discharge: 2018-07-07 | Disposition: A | Discharge status: Home / Self Care | Payer: BLUE CROSS/BLUE SHIELD | Source: Ambulatory Visit | Attending: *Deleted | Admitting: *Deleted

## 2018-07-07 DIAGNOSIS — N6320 Unspecified lump in the left breast, unspecified quadrant: Secondary | ICD-10-CM

## 2018-09-08 ENCOUNTER — Ambulatory Visit: Payer: BLUE CROSS/BLUE SHIELD | Admitting: Internal Medicine

## 2018-09-12 ENCOUNTER — Encounter: Payer: Self-pay | Admitting: Internal Medicine

## 2018-09-12 ENCOUNTER — Ambulatory Visit (INDEPENDENT_AMBULATORY_CARE_PROVIDER_SITE_OTHER): Payer: BLUE CROSS/BLUE SHIELD | Admitting: Internal Medicine

## 2018-09-12 VITALS — BP 140/98 | HR 84 | Resp 14 | Ht 68.0 in | Wt 258.0 lb

## 2018-09-12 DIAGNOSIS — F1010 Alcohol abuse, uncomplicated: Secondary | ICD-10-CM

## 2018-09-12 DIAGNOSIS — E039 Hypothyroidism, unspecified: Secondary | ICD-10-CM

## 2018-09-12 DIAGNOSIS — K047 Periapical abscess without sinus: Secondary | ICD-10-CM

## 2018-09-12 DIAGNOSIS — I10 Essential (primary) hypertension: Secondary | ICD-10-CM | POA: Diagnosis not present

## 2018-09-12 MED ORDER — AMLODIPINE BESYLATE 5 MG PO TABS: 5.0000 mg | ORAL_TABLET | Freq: Every day | ORAL | 11 refills | Status: DC
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
## 2018-09-19 ENCOUNTER — Ambulatory Visit: Payer: BLUE CROSS/BLUE SHIELD | Admitting: Internal Medicine

## 2018-11-05 ENCOUNTER — Other Ambulatory Visit: Payer: Self-pay | Admitting: Internal Medicine

## 2018-12-15 ENCOUNTER — Ambulatory Visit: Payer: BLUE CROSS/BLUE SHIELD | Admitting: Internal Medicine

## 2018-12-27 ENCOUNTER — Other Ambulatory Visit: Payer: Self-pay | Admitting: Internal Medicine

## 2018-12-30 NOTE — Telephone Encounter (Signed)
To Dr. Mulberry for approval 

## 2018-12-30 NOTE — Telephone Encounter (Signed)
Please call Tilla, find out what she is taking, if she has taken for at least past 8 weeks on daily basis.  She needs a TSH to determine what her dose should be.

## 2018-12-31 NOTE — Telephone Encounter (Signed)
Were you able to get hold of her about this yet?

## 2018-12-31 NOTE — Telephone Encounter (Signed)
Spoke with patient. States she has been taking Levothyroxine 150 mcg 1 daily. States she has been on that dose for 6-7 years. Patient states she had a TSH drawn back in December when she was in treatment. Patient want s to know if she needs to come back in to have lab drawn again since she had just had it done in December. Patient states she has 2 pills left and she will do whatever it is you need her to do.

## 2019-08-27 ENCOUNTER — Emergency Department (HOSPITAL_COMMUNITY)
Admission: EM | Admit: 2019-08-27 | Discharge: 2019-08-27 | Discharge status: Against Medical Advice | Payer: 59 | Attending: Emergency Medicine | Admitting: Emergency Medicine

## 2019-08-27 ENCOUNTER — Encounter (HOSPITAL_COMMUNITY): Payer: Self-pay | Admitting: Emergency Medicine

## 2019-08-27 ENCOUNTER — Emergency Department (HOSPITAL_COMMUNITY): Payer: 59

## 2019-08-27 DIAGNOSIS — Z5321 Procedure and treatment not carried out due to patient leaving prior to being seen by health care provider: Secondary | ICD-10-CM | POA: Insufficient documentation

## 2019-08-27 DIAGNOSIS — R079 Chest pain, unspecified: Secondary | ICD-10-CM | POA: Insufficient documentation

## 2019-08-27 LAB — BASIC METABOLIC PANEL
Anion gap: 10 (ref 5–15)
BUN: 14 mg/dL (ref 6–20)
CO2: 26 mmol/L (ref 22–32)
Calcium: 9.3 mg/dL (ref 8.9–10.3)
Chloride: 104 mmol/L (ref 98–111)
Creatinine, Ser: 0.94 mg/dL (ref 0.44–1.00)
GFR calc Af Amer: >60 mL/min (ref >60)
GFR calc non Af Amer: >60 mL/min (ref >60)
Glucose, Bld: 145 mg/dL — ABNORMAL HIGH (ref 70–99)
Potassium: 4.4 mmol/L (ref 3.5–5.1)
Sodium: 140 mmol/L (ref 135–145)

## 2019-08-27 LAB — CBC
HCT: 44.4 % (ref 36.0–46.0)
Hemoglobin: 14.3 g/dL (ref 12.0–15.0)
MCH: 30 pg (ref 26.0–34.0)
MCHC: 32.2 g/dL (ref 30.0–36.0)
MCV: 93.1 fL (ref 80.0–100.0)
Platelets: 174 10*3/uL (ref 150–400)
RBC: 4.77 MIL/uL (ref 3.87–5.11)
RDW: 12.8 % (ref 11.5–15.5)
WBC: 8.5 10*3/uL (ref 4.0–10.5)
nRBC: 0 % (ref 0.0–0.2)

## 2019-08-27 LAB — TROPONIN I (HIGH SENSITIVITY): Troponin I (High Sensitivity): 2 ng/L (ref <18)

## 2019-08-27 MED ORDER — SODIUM CHLORIDE 0.9% FLUSH: 3.0000 mL | Freq: Once | INTRAVENOUS | Status: DC

## 2019-08-27 NOTE — ED Triage Notes (Signed)
Pt arrives to ED with CP in center of  chest feels like a heavy pressure and burning. Pt states this started last night but felt suddenly worse when she got ready to leave for work this morning.

## 2019-08-27 NOTE — ED Notes (Signed)
Pt name called x3 with no response. 

## 2019-11-05 ENCOUNTER — Ambulatory Visit: Payer: 59 | Attending: Internal Medicine

## 2019-11-05 DIAGNOSIS — Z23 Encounter for immunization: Secondary | ICD-10-CM

## 2019-11-05 NOTE — Progress Notes (Signed)
   Covid-19 Vaccination Clinic  Name:  Maria Robles    MRN: [id number] DOB: [birth year]  11/05/2019  [name] was observed post Covid-19 immunization for 15 minutes without incident. She was provided with Vaccine Information Sheet and instruction to access the V-Safe system.   [name] was instructed to call 911 with any severe reactions post vaccine: Marland Kitchen Difficulty breathing  . Swelling of face and throat  . A fast heartbeat  . A bad rash all over body  . Dizziness and weakness   Immunizations Administered    Name Date Dose VIS Date Route   Pfizer COVID-19 Vaccine 11/05/2019  1:54 PM 0.3 mL 07/17/2019 Intramuscular   Manufacturer: [employer], [employer]   Lot: [id number]   NDC: [address]-0

## 2019-11-30 ENCOUNTER — Ambulatory Visit: Payer: 59 | Attending: Internal Medicine

## 2019-11-30 DIAGNOSIS — Z23 Encounter for immunization: Secondary | ICD-10-CM

## 2019-11-30 NOTE — Progress Notes (Signed)
   Covid-19 Vaccination Clinic  Name:  Maria Robles    MRN: [id number] DOB: Apr 07, 1966  11/30/2019  [name] was observed post Covid-19 immunization for 30 minutes based on pre-vaccination screening without incident. She was provided with Vaccine Information Sheet and instruction to access the V-Safe system.   [name] was instructed to call 911 with any severe reactions post vaccine: Marland Kitchen Difficulty breathing  . Swelling of face and throat  . A fast heartbeat  . A bad rash all over body  . Dizziness and weakness   Immunizations Administered    Name Date Dose VIS Date Route   Pfizer COVID-19 Vaccine 11/30/2019  9:58 AM 0.3 mL 09/30/2018 Intramuscular   Manufacturer: [employer], [employer]   Lot: [id number]   NDC: [address]-8

## 2019-11-30 NOTE — Progress Notes (Signed)
   Covid-19 Vaccination Clinic  Name:  Maria Robles    MRN: [id number] DOB: 05-27-1966  11/30/2019  [name] was observed post Covid-19 immunization for 15 minutes without incident. She was provided with Vaccine Information Sheet and instruction to access the V-Safe system.   [name] was instructed to call 911 with any severe reactions post vaccine: Marland Kitchen Difficulty breathing  . Swelling of face and throat  . A fast heartbeat  . A bad rash all over body  . Dizziness and weakness   Immunizations Administered    Name Date Dose VIS Date Route   Pfizer COVID-19 Vaccine 11/30/2019  9:58 AM 0.3 mL 09/30/2018 Intramuscular   Manufacturer: [employer], [employer]   Lot: [id number]   NDC: [address]-8

## 2020-09-19 DIAGNOSIS — Z1211 Encounter for screening for malignant neoplasm of colon: Secondary | ICD-10-CM | POA: Insufficient documentation

## 2020-09-19 DIAGNOSIS — R1310 Dysphagia, unspecified: Secondary | ICD-10-CM | POA: Insufficient documentation

## 2020-09-19 DIAGNOSIS — N182 Chronic kidney disease, stage 2 (mild): Secondary | ICD-10-CM | POA: Insufficient documentation

## 2020-09-19 DIAGNOSIS — R0789 Other chest pain: Secondary | ICD-10-CM | POA: Insufficient documentation

## 2020-11-03 IMAGING — US ULTRASOUND LEFT BREAST LIMITED
1 series · 12 of 12 positions shown · non-contrast
Comparison: Previous exam(s).

ACR Breast Density Category a: The breast tissue is almost entirely
fatty.

CLINICAL DATA: 52-year-old female presenting for evaluation of a
palpable lump in the left breast identified 6 weeks ago.

EXAM:
DIGITAL DIAGNOSTIC BILATERAL MAMMOGRAM WITH CAD AND TOMO
ULTRASOUND LEFT BREAST

[Series 1: ultrasound left breast limited · 0.06mm/px · 12 of 12 slices shown]
[im 1/12]
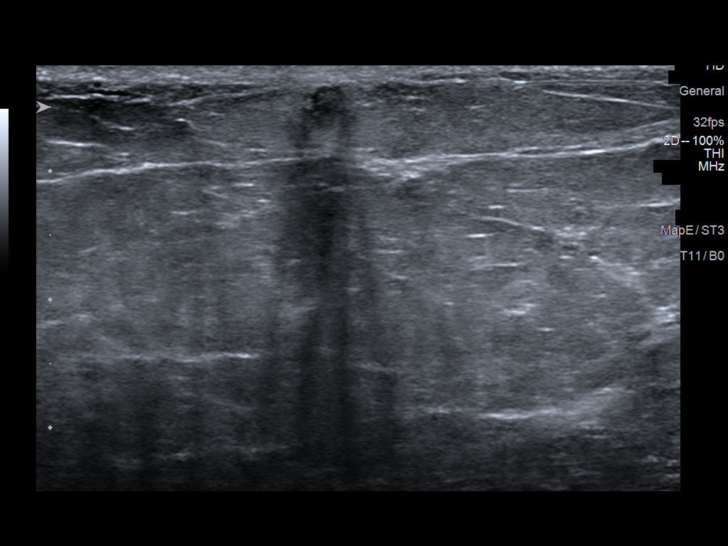
[im 2/12]
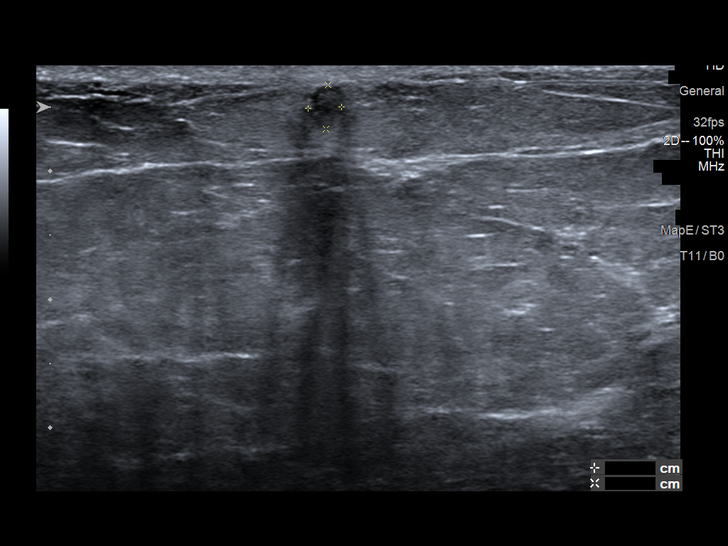
[im 3/12]
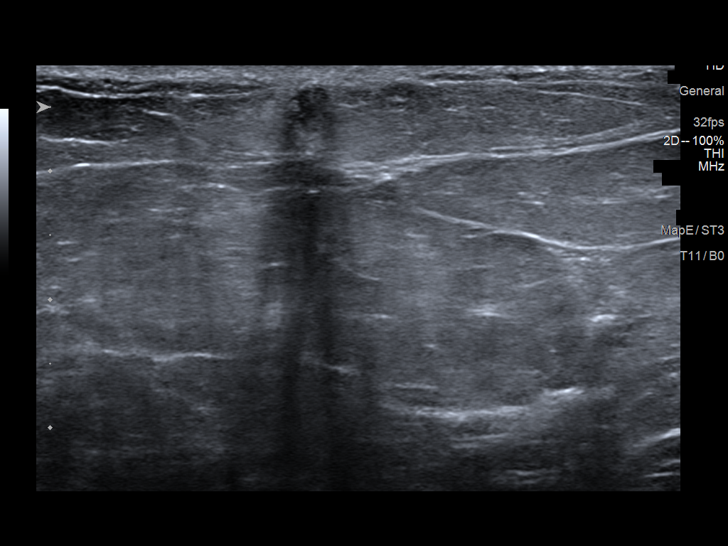
[im 4/12]
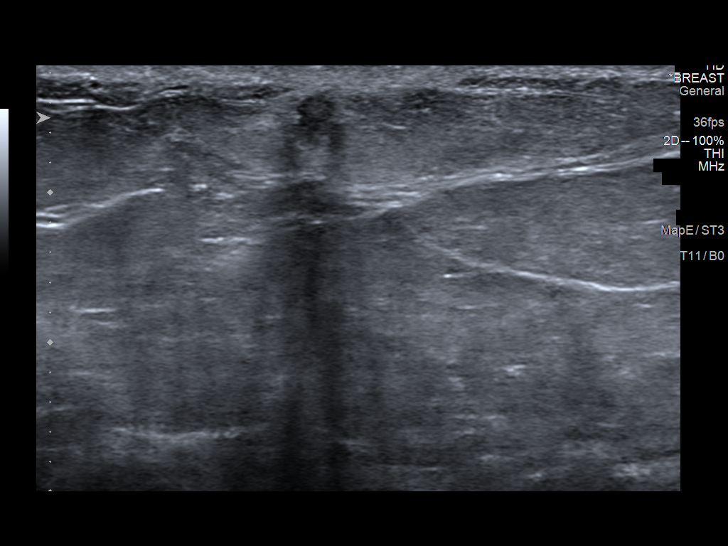
[im 5/12]
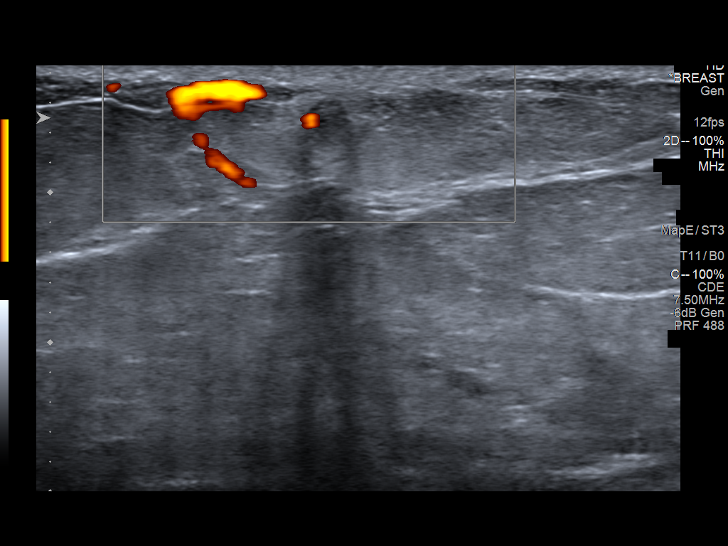
[im 6/12]
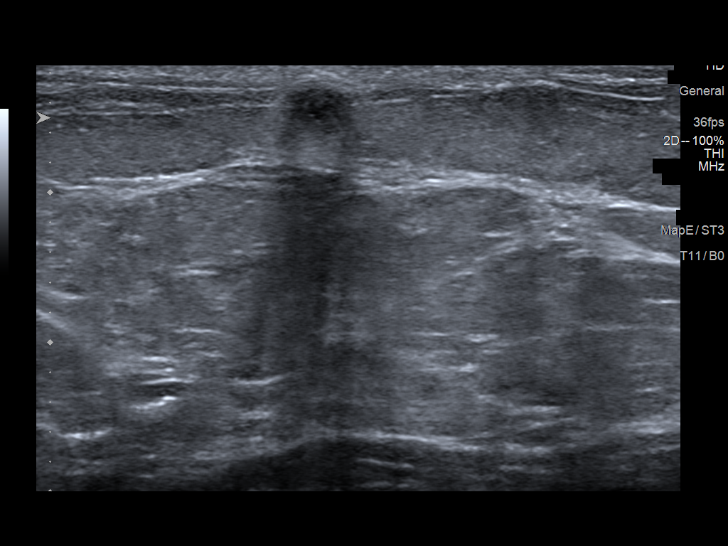
[im 7/12]
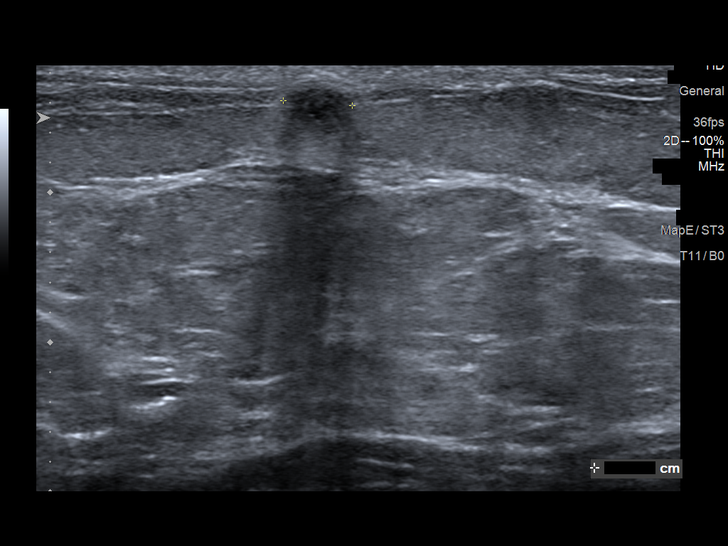
[im 8/12]
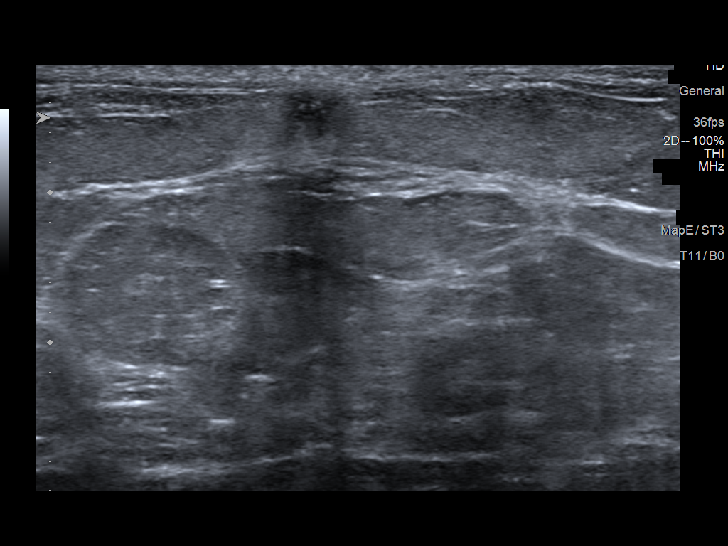
[im 9/12]
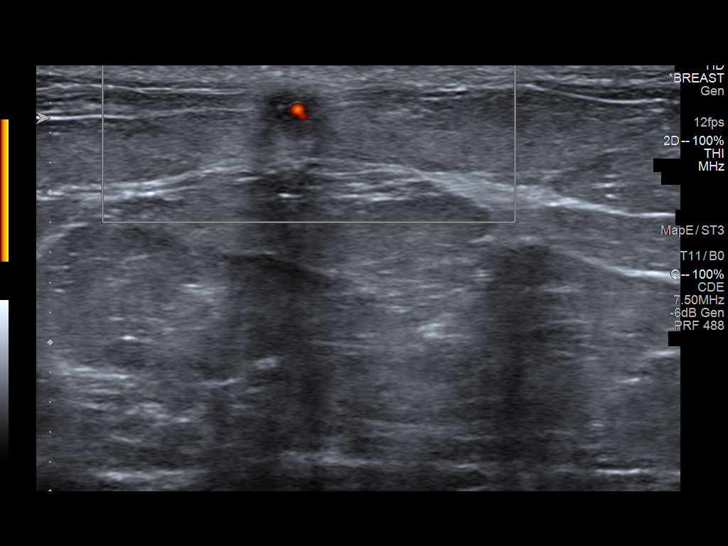
[im 10/12]
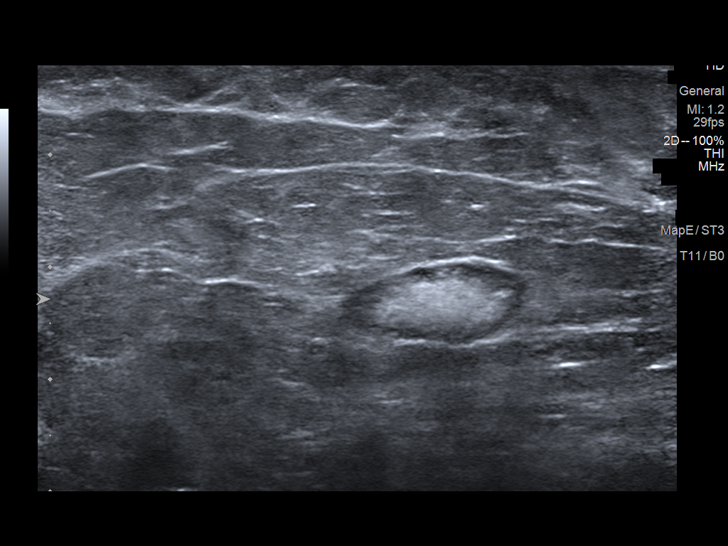
[im 11/12]
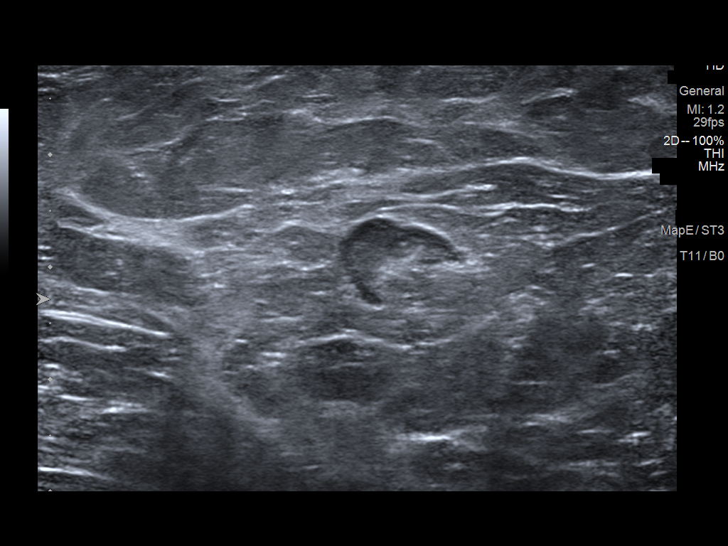
[im 12/12]
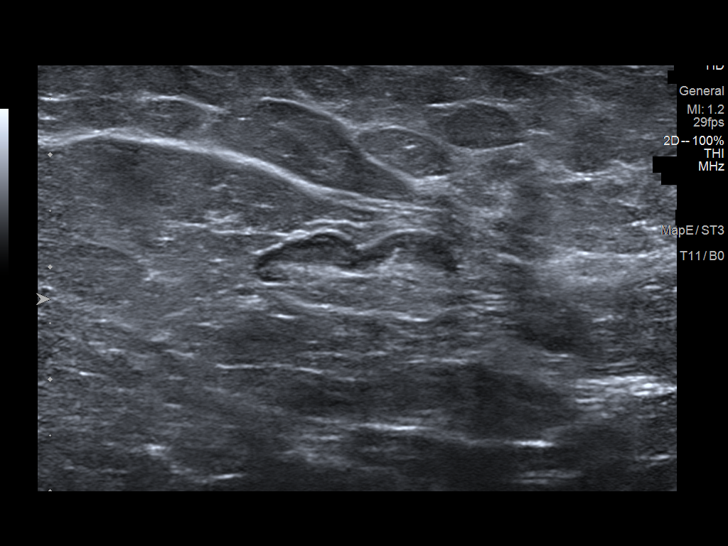

[12 of 12 positions shown; findings below may reference images not displayed]

FINDINGS: A BB indicating the palpable site of concern has been placed on the
superior aspect of the left breast. On the spot compression
tangential view over the palpable site there is a superficial 5 mm
circumscribed oval mass associated with a coarse calcification. No
other suspicious calcifications, masses or areas of distortion are
seen in the bilateral breasts.

Mammographic images were processed with CAD.

On physical exam, there is a BB sized firm palpable lump at the site
of concern at 12 o'clock in the left breast.

Targeted ultrasound is performed, showing a hypoechoic ill-defined
superficial mass with central blood flow measuring 5 x 3 x 3 mm.
Ultrasound of the left axilla demonstrates normal-appearing lymph
nodes.
IMPRESSION: 1. There is an indeterminate palpable mass in the left breast at 12
o'clock.

2.  No evidence of left axillary lymphadenopathy.

3.  No mammographic evidence of right breast malignancy.

RECOMMENDATION:
1. Ultrasound-guided biopsy is recommended for the left breast mass.
This has been scheduled for 07/07/2018.

I have discussed the findings and recommendations with the patient.
Results were also provided in writing at the conclusion of the
visit. If applicable, a reminder letter will be sent to the patient
regarding the next appointment.

BI-RADS CATEGORY  4: Suspicious.

## 2020-11-06 DIAGNOSIS — I5032 Chronic diastolic (congestive) heart failure: Secondary | ICD-10-CM | POA: Insufficient documentation

## 2020-11-10 DIAGNOSIS — N6315 Unspecified lump in the right breast, overlapping quadrants: Secondary | ICD-10-CM | POA: Insufficient documentation

## 2020-11-10 IMAGING — US US BREAST BX W LOC DEV 1ST LESION IMG BX SPEC US GUIDE*L*
1 series · 13 of 14 positions shown · non-contrast
Comparison: Previous exam(s).

Addendum:
CLINICAL DATA: 52-year-old who has a palpable indeterminate 5 mm
superficial mass in the LEFT breast at the 11:30 o'clock position
approximately 10 cm from the nipple.

EXAM:
ULTRASOUND GUIDED LEFT BREAST CORE NEEDLE BIOPSY

[Series 1: us breast bx w loc dev 1st lesion img bx spec us g · 0.06mm/px · 13 of 14 slices shown]
[im 1/14]
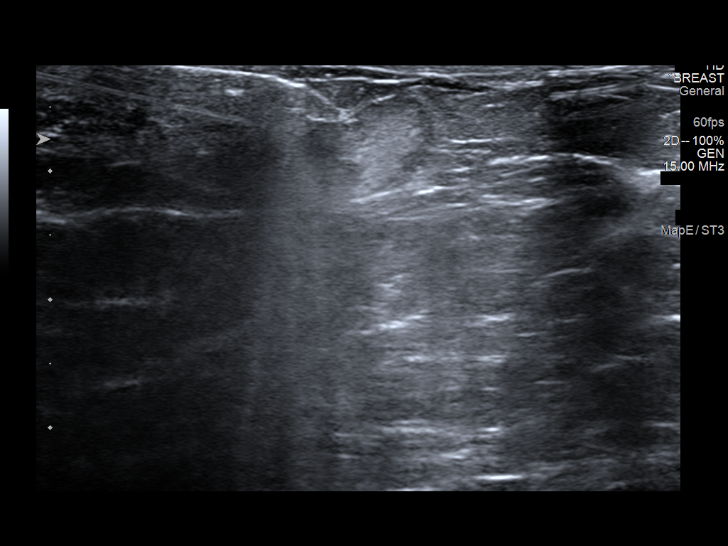
[im 2/14]
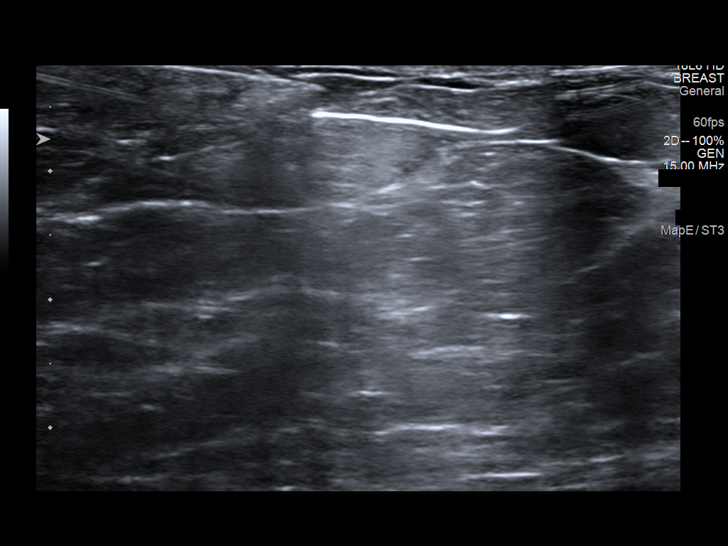
[im 3/14]
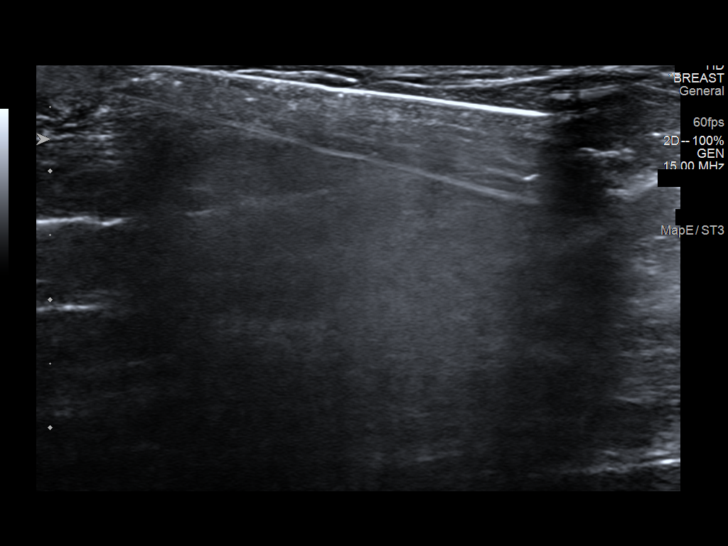
[im 4/14]
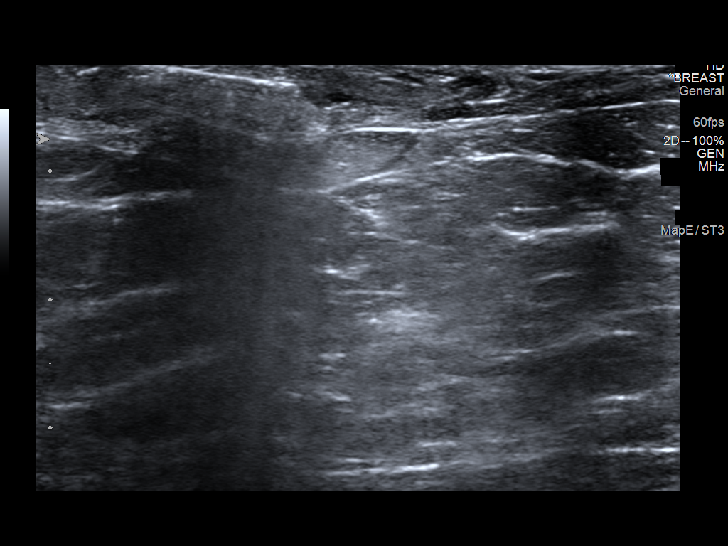
[im 5/14]
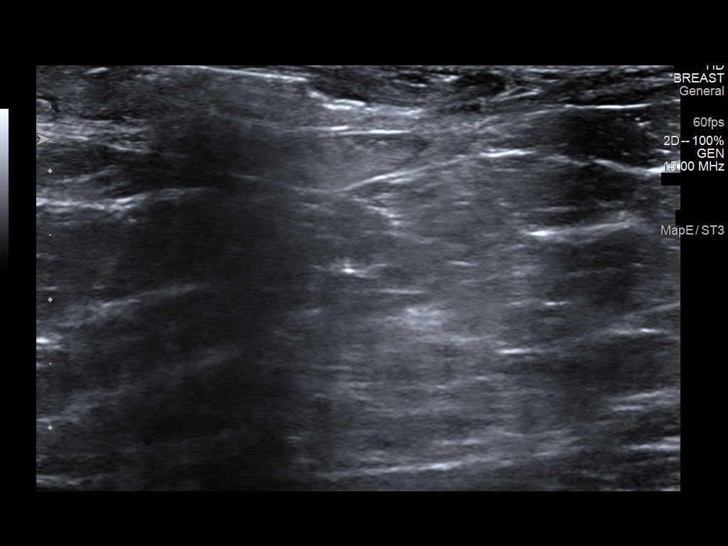
[im 6/14]
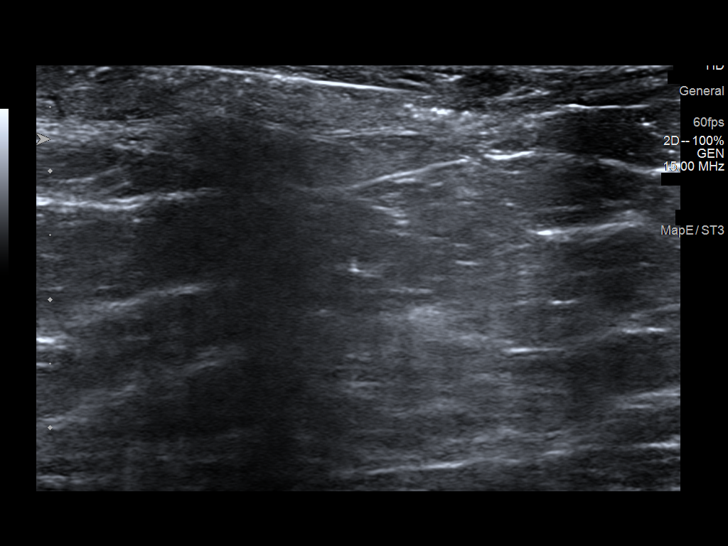
[im 8/14]
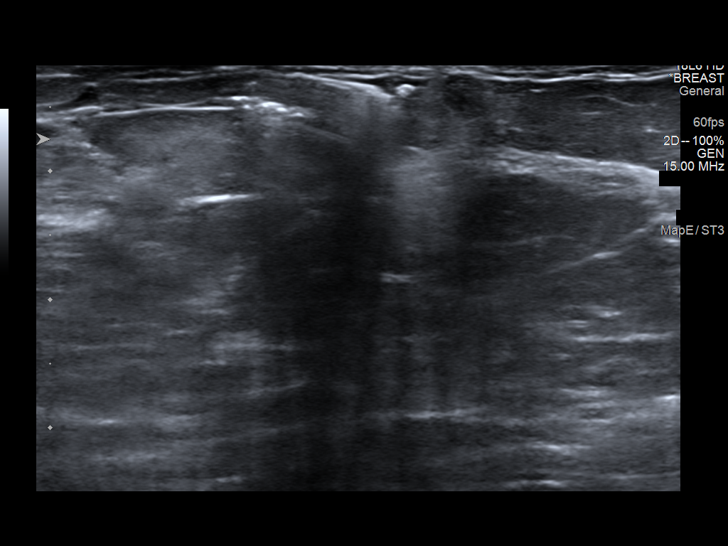
[im 9/14]
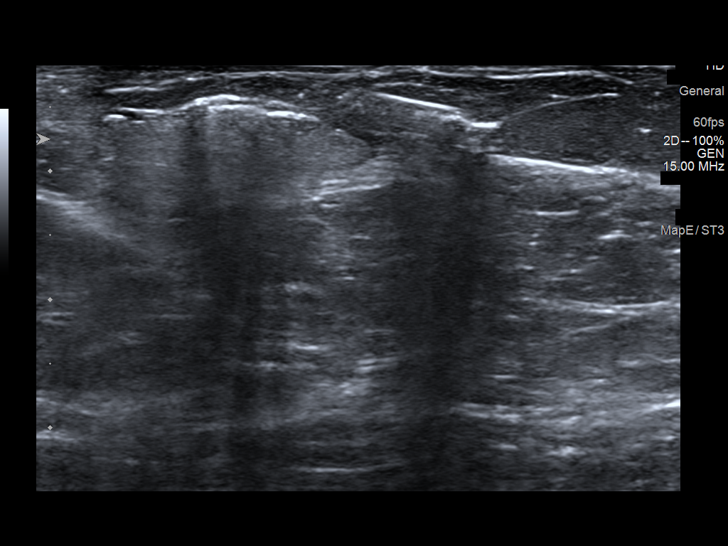
[im 10/14]
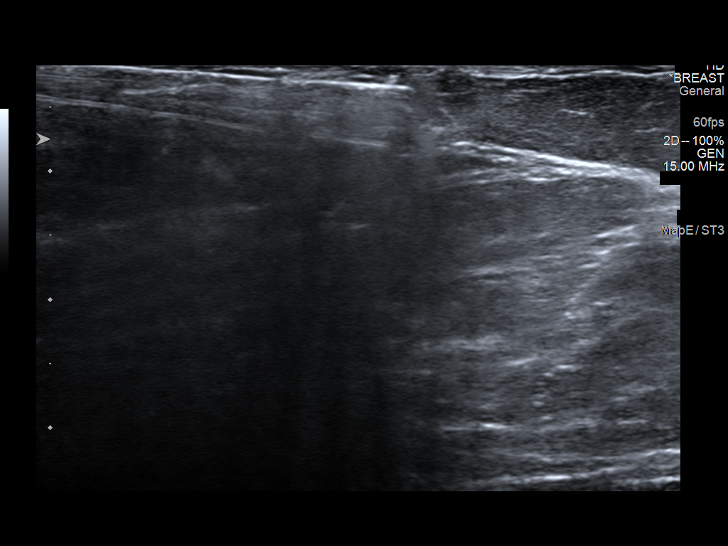
[im 11/14]
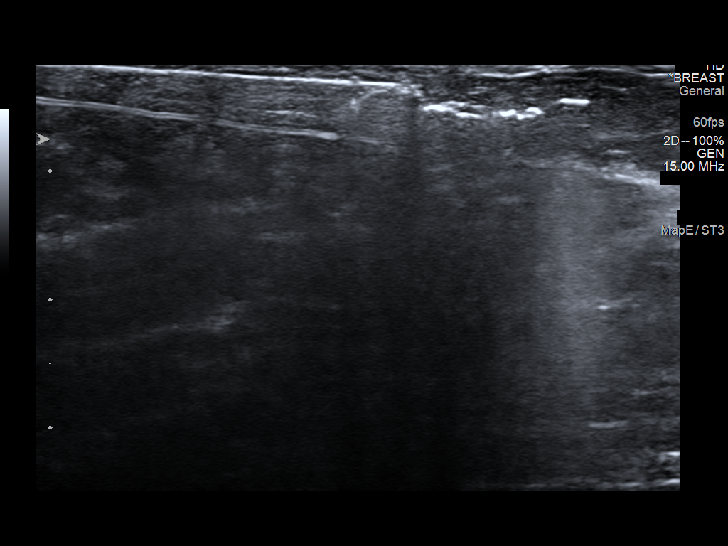
[im 12/14]
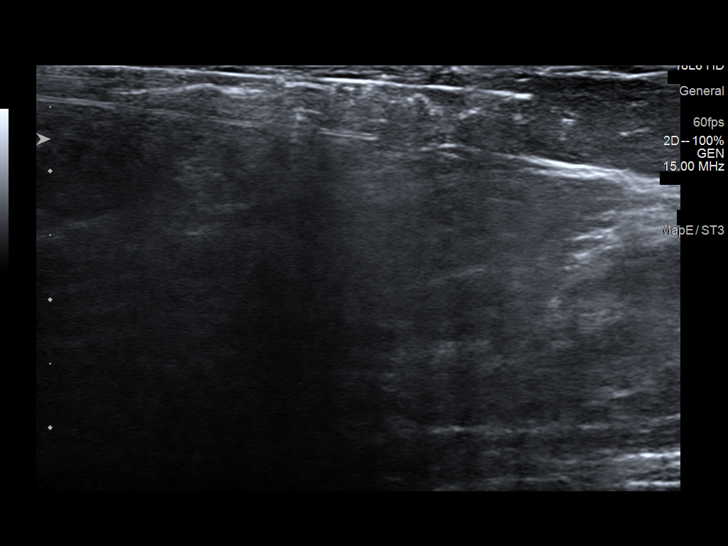
[im 13/14]
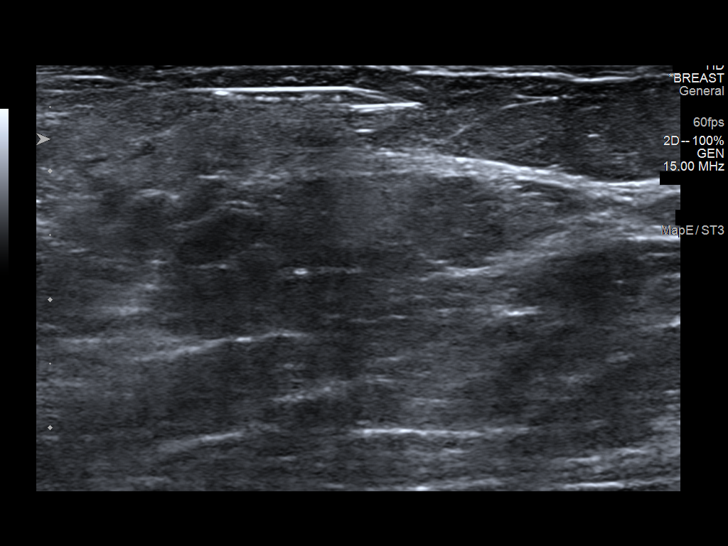
[im 14/14]
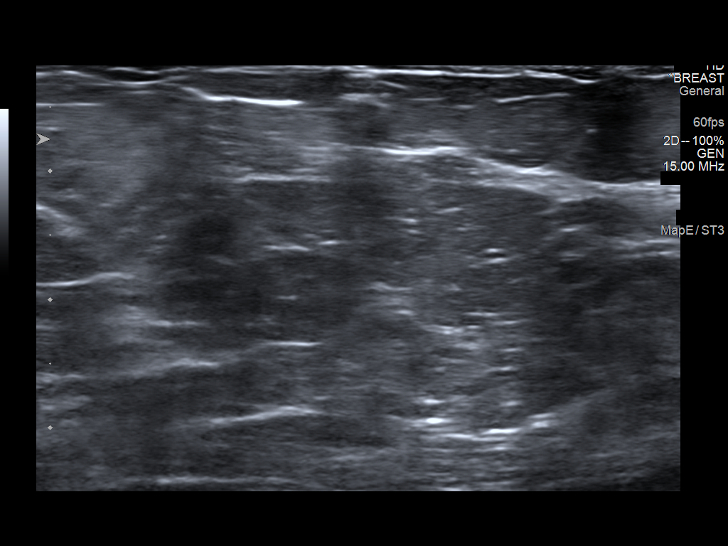

[13 of 14 positions shown; findings below may reference images not displayed]



Lesion quadrant: UPPER INNER QUADRANT.

Using sterile technique with chlorhexidine as skin antisepsis, 1%
lidocaine and 1% lidocaine with epinephrine as local anesthetic,
under direct ultrasound visualization, a 12 gauge Quirijn Amazigh core
needle device placed through an 11 gauge introducer needle was used
to perform biopsy of the superficial mass in the UPPER INNER
QUADRANT the LEFT breast using a MEDIAL approach. At the conclusion
of the procedure a ribbon shaped tissue marker clip was deployed
into the biopsy cavity. Follow up 2 view mammogram was performed and
dictated separately.
IMPRESSION: Ultrasound guided biopsy of an indeterminate superficial mass
involving the UPPER INNER QUADRANT of the LEFT breast. No apparent
complications.

ADDENDUM:
Pathology revealed BENIGN BREAST TISSUE WITH MILD FIBROSIS AND
CLUSTERS OF HISTIOCYTES, LIKELY SECONDARY TO FAT NECROSIS of the
Left breast, upper inner quadrant, 11:30 o'clock. This was found to
be concordant by Dr. Hanson Wendt.

Pathology results were discussed with the patient by telephone. The
patient reported doing well after the biopsy with tenderness at the
site. Post biopsy instructions and care were reviewed and questions
were answered. The patient was encouraged to call The [REDACTED]

The patient was instructed to return for annual screening
mammography and informed a reminder notice would be sent regarding
this appointment.

Pathology results reported by Norvin Liss, RN on 07/09/2018.

*** End of Addendum ***

## 2021-12-31 IMAGING — CR DG CHEST 2V
2 series · 2 of 2 positions shown · non-contrast
Comparison: 03/21/2018

CLINICAL DATA: Chest pain

EXAM:
CHEST - 2 VIEW

[chest pa]
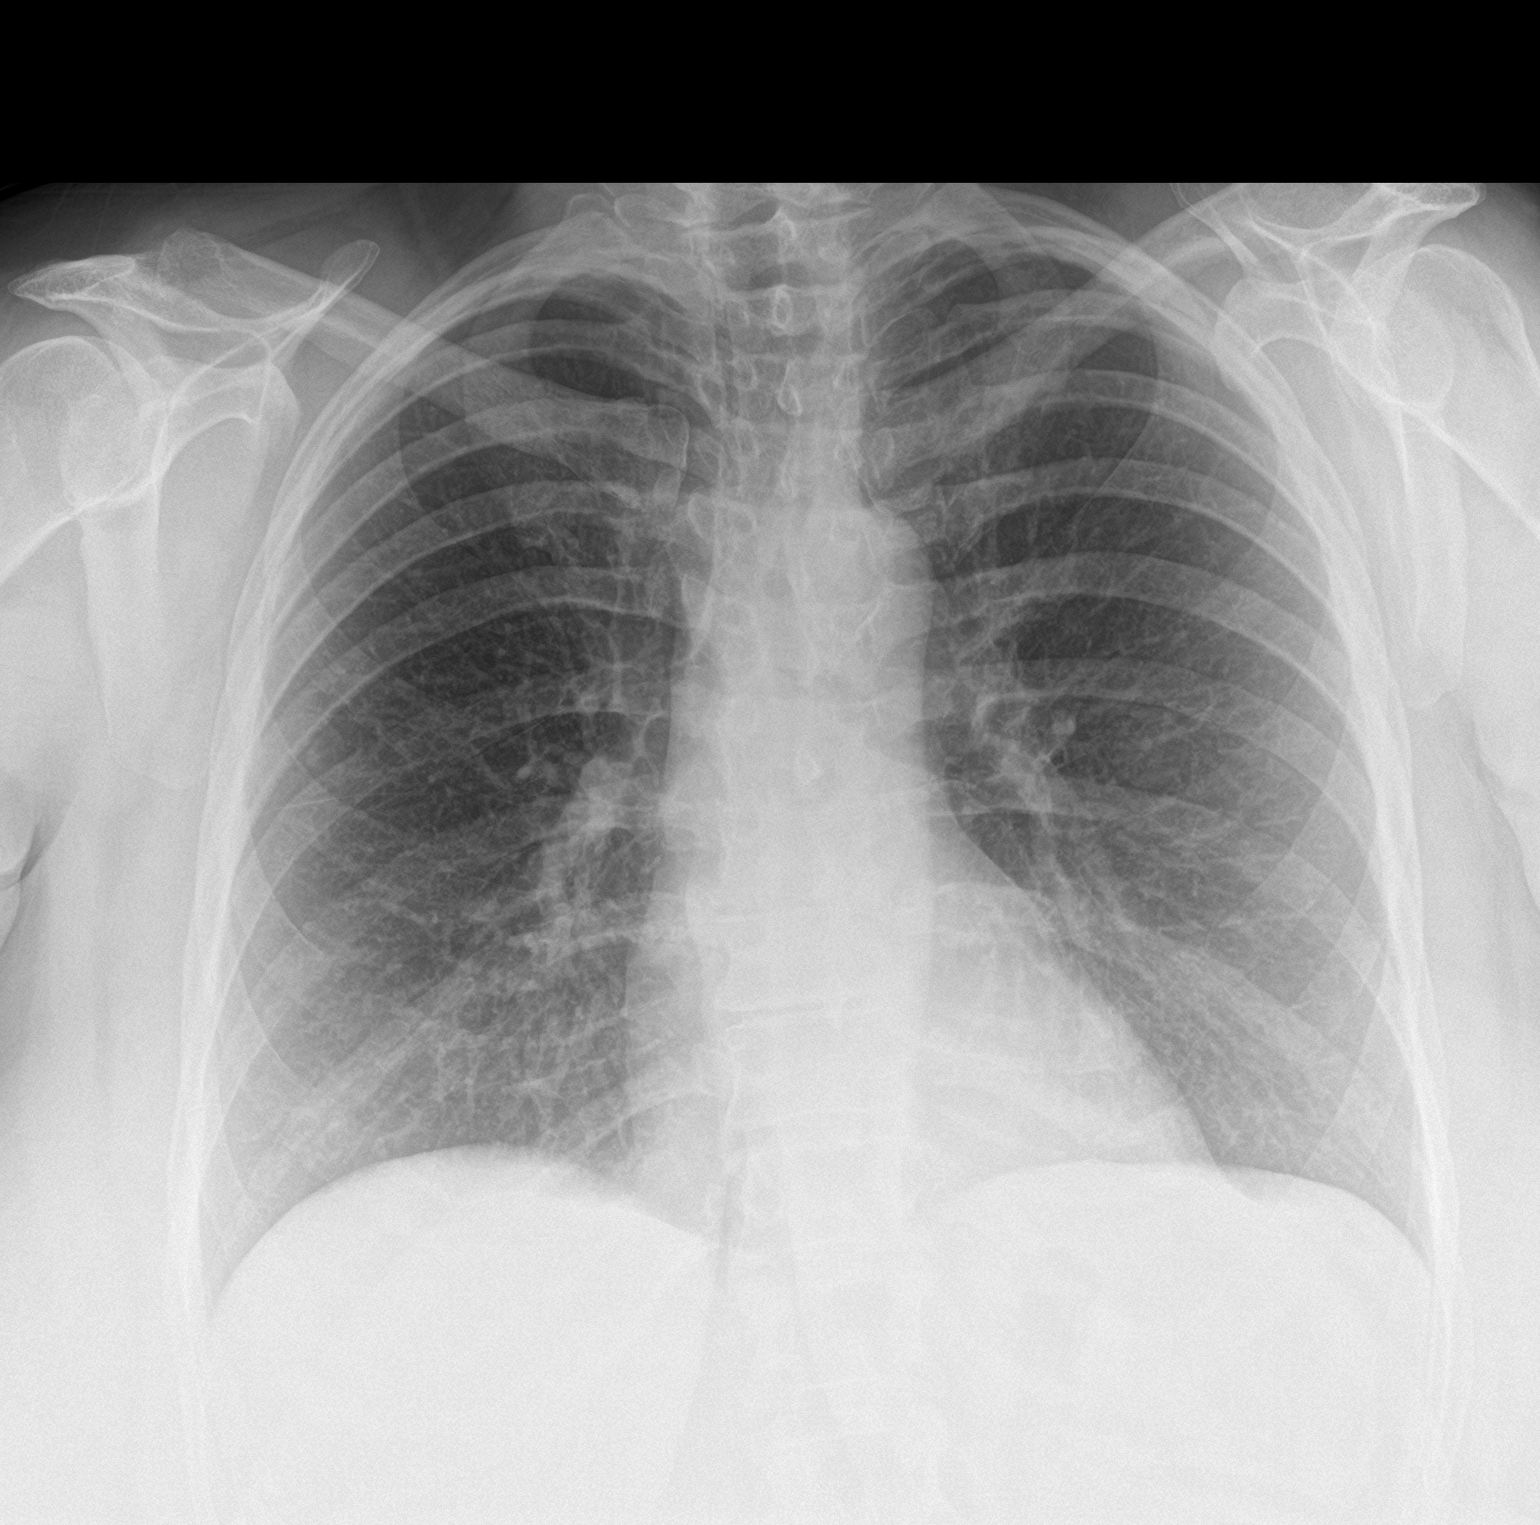

[chest lat]
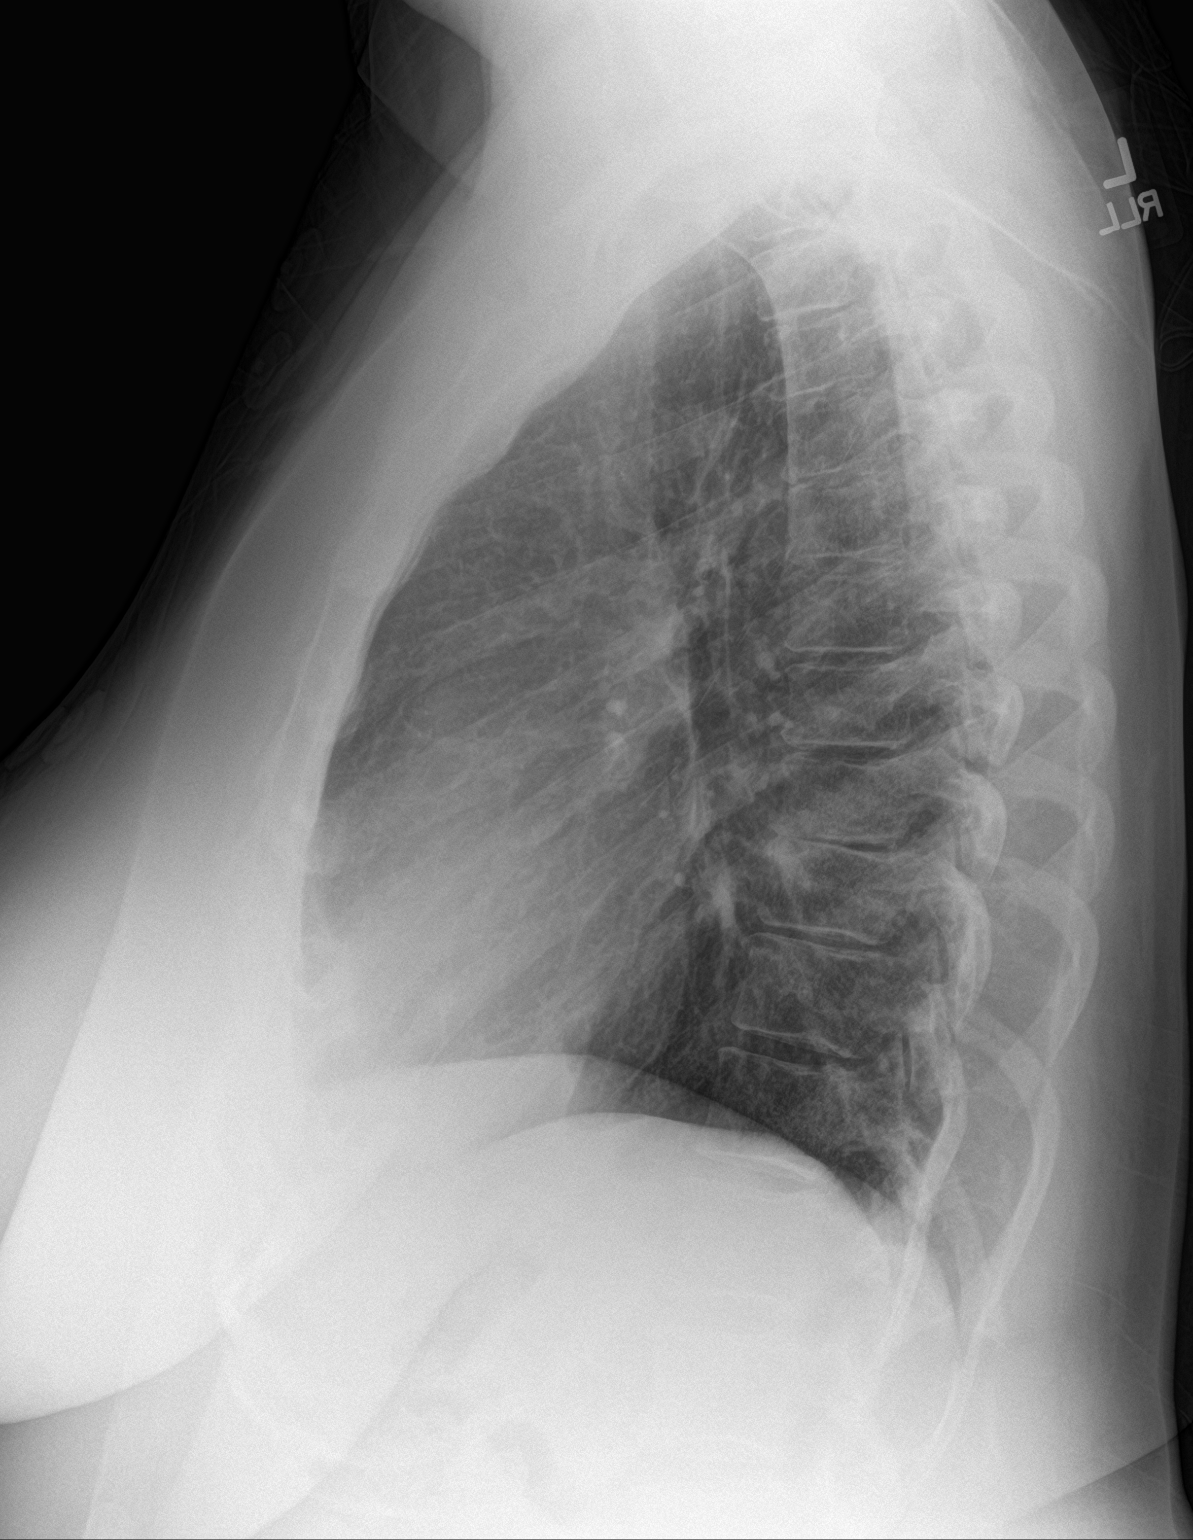

[2 of 2 positions shown; findings below may reference images not displayed]

FINDINGS: The heart size and mediastinal contours are within normal limits.
Both lungs are clear. The visualized skeletal structures are
unremarkable.
IMPRESSION: No acute abnormality of the lungs.

## 2022-07-16 ENCOUNTER — Other Ambulatory Visit: Payer: Self-pay | Admitting: Family Medicine

## 2022-07-16 DIAGNOSIS — R413 Other amnesia: Secondary | ICD-10-CM

## 2023-01-11 ENCOUNTER — Ambulatory Visit (INDEPENDENT_AMBULATORY_CARE_PROVIDER_SITE_OTHER): Payer: Medicaid Other

## 2023-01-11 ENCOUNTER — Ambulatory Visit
Admission: EM | Admit: 2023-01-11 | Discharge: 2023-01-11 | Disposition: A | Discharge status: Home / Self Care | Payer: Medicaid Other | Attending: Urgent Care | Admitting: Urgent Care

## 2023-01-11 DIAGNOSIS — M25551 Pain in right hip: Secondary | ICD-10-CM

## 2023-01-11 DIAGNOSIS — I10 Essential (primary) hypertension: Secondary | ICD-10-CM | POA: Diagnosis not present

## 2023-01-11 DIAGNOSIS — E039 Hypothyroidism, unspecified: Secondary | ICD-10-CM | POA: Diagnosis not present

## 2023-01-11 DIAGNOSIS — M1611 Unilateral primary osteoarthritis, right hip: Secondary | ICD-10-CM | POA: Diagnosis not present

## 2023-01-11 MED ORDER — LEVOTHYROXINE SODIUM 150 MCG PO TABS: ORAL_TABLET | ORAL | 0 refills | Status: DC

## 2023-01-11 MED ORDER — ACETAMINOPHEN 325 MG PO TABS: 650.0000 mg | ORAL_TABLET | Freq: Four times a day (QID) | ORAL | 0 refills | Status: DC | PRN

## 2023-01-11 MED ORDER — LISINOPRIL-HYDROCHLOROTHIAZIDE 10-12.5 MG PO TABS: 1.0000 | ORAL_TABLET | Freq: Every day | ORAL | 0 refills | Status: DC

## 2023-01-11 NOTE — ED Provider Notes (Signed)
[facility]  Note:  This document was prepared using Conservation officer, historic buildings and may include unintentional dictation errors.  MRN: [id number] DOB: 04-Jul-1966  Subjective:   Maria Robles is a 57 y.o. female presenting for multiple concerns.  Needs a medication refill.  Is out of her levothyroxine.  Has been steady on 150 mcg once daily for years.  She does have follow-up scheduled at some point in July.  This will be for a recheck on her levels. Reports 4-week history of persistent right hip pain.  Patient recently moved.  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  She is also doing a lot of work, standing and walking.  Has been trying to do a lot of exercise on a rowing machine and water aerobics.  Otherwise, has not had any particular fall, trauma directly to the hip. Has a history of high blood pressure but is not compliant with her blood pressure medications.  She would like to restart this.  No headache, confusion, weakness, numbness or tingling, chest pain, palpitations, diaphoresis, nausea, vomiting, abdominal pain.  No history of MI, kidney disease, stroke.  No current facility-administered medications for this encounter.  Current Outpatient Medications:    amLODipine (NORVASC) 5 MG tablet, TAKE 1 TABLET BY MOUTH EVERY DAY, Disp: 90 tablet, Rfl: 3   benzonatate (TESSALON) 100 MG capsule, Take 1-2 capsules (100-200 mg total) by mouth 3 (three) times daily as needed for cough. (Patient not taking: Reported on 09/12/2018), Disp: 40 capsule, Rfl: 0   levothyroxine (SYNTHROID) 150 MCG tablet, TAKE 1 TABLET BY MOUTH ONCE DAILY ON  EMPTY  STOMACH, Disp: 90 tablet, Rfl: 3   metoprolol tartrate (LOPRESSOR) 25 MG tablet, Take 1 tablet (25 mg total) by mouth 2 (two) times daily. 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   Hypothyroidism 1997   Recurrent UTI 1990s     Past Surgical History:  Procedure Laterality Date   APPENDECTOMY  2008   benign tumor Right Late 1990s   R shoulder    left tooth extraction Left 06/2015   with associated abscess   TUBAL LIGATION  1993    Family History  Problem Relation Age of Onset   Tremor Mother        Familial essential tremor   GER disease Mother    Arrhythmia Mother        History of what sounds like palpitations, but no findings on monitor   Depression Mother        On low dose Prozac since patient a young girl   Asthma Sister    Drug abuse Son    Diabetes Neg Hx    Heart attack Neg Hx    Hypertension Neg Hx    Coronary artery disease Neg Hx    Stroke Neg Hx    Cancer Neg Hx        breast or colon     Social History   Tobacco Use   Smoking status: Every  Day    Packs/day: 0.20    Years: 0.00    Additional pack years: 0.00    Total pack years: 0.00    Types: Cigarettes   Smokeless tobacco: Never   Tobacco comments:    3-4 cigarettes daily  Vaping Use   Vaping Use: Never used  Substance Use Topics   Alcohol use: Not Currently   Drug use: No    ROS   Objective:   Vitals: BP (!) 177/112 (BP Location: Right Arm)   Pulse 74   Temp 98.3 F (36.8 C) (Oral)   Resp 20   LMP  (LMP Unknown)   SpO2 95%   Physical Exam Constitutional:      General: She is not in acute distress.    Appearance: Normal appearance. She is well-developed. She is not ill-appearing, toxic-appearing or diaphoretic.  HENT:     Head: Normocephalic and atraumatic.     Nose: Nose normal.     Mouth/Throat:     Mouth: Mucous membranes are moist.  Eyes:     General: No scleral  icterus.       Right eye: No discharge.        Left eye: No discharge.     Extraocular Movements: Extraocular movements intact.  Neck:     Vascular: No carotid bruit.  Cardiovascular:     Rate and Rhythm: Normal rate and regular rhythm.     Heart sounds: Normal heart sounds. No murmur heard.    No friction rub. No gallop.  Pulmonary:     Effort: Pulmonary effort is normal. No respiratory distress.     Breath sounds: No stridor. No wheezing, rhonchi or rales.  Chest:     Chest wall: No tenderness.  Musculoskeletal:     Right hip: Tenderness and bony tenderness present. No deformity, lacerations or crepitus. Decreased range of motion. Normal strength.  Skin:    General: Skin is warm and dry.  Neurological:     General: No focal deficit present.     Mental Status: She is alert and oriented to person, place, and time.     Cranial Nerves: No cranial nerve deficit.     Motor: No weakness.     Coordination: Coordination normal.     Gait: Gait normal.     Comments: Negative Romberg and pronator drift.  No facial asymmetry.  Psychiatric:        Mood and Affect: Mood normal.        Behavior: Behavior normal.    DG Hip Unilat With Pelvis 2-3 Views Right  Result Date: 01/11/2023 CLINICAL DATA:  Right hip pain for 3-4 weeks. No known injury. EXAM: DG HIP (WITH OR WITHOUT PELVIS) 2-3V RIGHT COMPARISON:  None Available. FINDINGS: Minimal right hip joint space narrowing with acetabular spurring. Femoral head is well seated in the acetabulum. No fracture, erosion or avascular necrosis. Bony pelvis including pubic rami are intact. No evidence of focal bone lesion. The pubic symphysis and sacroiliac joints are congruent. Unremarkable soft tissues. IMPRESSION: Minimal right hip osteoarthritis. Electronically Signed   By: Narda Rutherford M.D.   On: 01/11/2023 18:39    Assessment and Plan :   PDMP not reviewed this encounter.  1. Right hip pain   2. Hypothyroidism, unspecified type   3.  Osteoarthritis of right hip, unspecified osteoarthritis type   4. Essential hypertension    Suspect inflammatory hip pain secondary to overuse and underlying mild osteoarthritis.  Will hold off on prednisone and recommend modification  of her physical activities.  Use Tylenol for pain relief.  I refilled her levothyroxine.  Recommend starting lisinopril hydrochlorothiazide for her central hypertension.  Follow-up with PCP.  No signs of an acute encephalopathy, acute orthopedic emergency.  Counseled patient on potential for adverse effects with medications prescribed/recommended today, ER and return-to-clinic precautions discussed, patient verbalized understanding.    Wallis Bamberg, [place] 01/11/23 1610

## 2023-01-11 NOTE — Discharge Instructions (Addendum)
For diabetes or elevated blood sugar, please make sure you are limiting and avoiding starchy, carbohydrate foods like pasta, breads, sweet breads, pastry, rice, potatoes, desserts. These foods can elevate your blood sugar. Also, limit and avoid drinks that contain a lot of sugar such as sodas, sweet teas, fruit juices.  Drinking plain water will be much more helpful, try 64 ounces of water daily.  It is okay to flavor your water naturally by cutting cucumber, lemon, mint or lime, placing it in a picture with water and drinking it over a period of 24-48 hours as long as it remains refrigerated.  For elevated blood pressure, make sure you are monitoring salt in your diet.  Do not eat restaurant foods and limit processed foods at home. I highly recommend you prepare and cook your own foods at home.  Processed foods include things like frozen meals, pre-seasoned meats and dinners, deli meats, canned foods as these foods contain a high amount of sodium/salt.  Make sure you are paying attention to sodium labels on foods you buy at the grocery store. Buy your spices separately such as garlic powder, onion powder, cumin, cayenne, parsley flakes so that you can avoid seasonings that contain salt. However, salt-free seasonings are available and can be used, an example is Mrs. Dash and includes a lot of different mixtures that do not contain salt.  Lastly, when cooking using oils that are healthier for you is important. This includes olive oil, avocado oil, canola oil. We have discussed a lot of foods to avoid but below is a list of foods that can be very healthy to use in your diet whether it is for diabetes, cholesterol, high blood pressure, or in general healthy eating.  Salads - kale, spinach, cabbage, spring mix, arugula Fruits - avocadoes, berries (blueberries, raspberries, blackberries), apples, oranges, pomegranate, grapefruit, kiwi Vegetables - asparagus, cauliflower, broccoli, green beans, brussel sprouts,  bell peppers, beets; stay away from or limit starchy vegetables like potatoes, carrots, peas Other general foods - kidney beans, egg whites, almonds, walnuts, sunflower seeds, pumpkin seeds, fat free yogurt, almond milk, flax seeds, quinoa, oats  Meat - It is better to eat lean meats and limit your red meat including pork to once a week.  Wild caught fish, chicken breast are good options as they tend to be leaner sources of good protein. Still be mindful of the sodium labels for the meats you buy.  DO NOT EAT ANY FOODS ON THIS LIST THAT YOU ARE ALLERGIC TO. For more specific needs, I highly recommend consulting a dietician or nutritionist but this can definitely be a good starting point.  

## 2023-01-11 NOTE — ED Triage Notes (Signed)
Pt requesting med refill levothyroxin 150 mcg daily-also c/o right hip pain x 3-4 weeks-denies injury-NAD-steady gait

## 2023-08-20 ENCOUNTER — Ambulatory Visit (HOSPITAL_COMMUNITY): Payer: Medicaid Other | Admitting: Clinical

## 2024-01-17 ENCOUNTER — Encounter: Payer: Self-pay | Admitting: Adult Health

## 2024-01-17 ENCOUNTER — Ambulatory Visit: Admitting: Adult Health

## 2024-01-17 VITALS — BP 136/86 | HR 76 | Temp 98.1°F | Ht 68.0 in | Wt 256.0 lb

## 2024-01-17 DIAGNOSIS — Z113 Encounter for screening for infections with a predominantly sexual mode of transmission: Secondary | ICD-10-CM

## 2024-01-17 DIAGNOSIS — M2550 Pain in unspecified joint: Secondary | ICD-10-CM | POA: Diagnosis not present

## 2024-01-17 DIAGNOSIS — Z124 Encounter for screening for malignant neoplasm of cervix: Secondary | ICD-10-CM

## 2024-01-17 DIAGNOSIS — Z1231 Encounter for screening mammogram for malignant neoplasm of breast: Secondary | ICD-10-CM

## 2024-01-17 DIAGNOSIS — E039 Hypothyroidism, unspecified: Secondary | ICD-10-CM

## 2024-01-17 DIAGNOSIS — Z7689 Persons encountering health services in other specified circumstances: Secondary | ICD-10-CM

## 2024-01-17 DIAGNOSIS — Z1212 Encounter for screening for malignant neoplasm of rectum: Secondary | ICD-10-CM

## 2024-01-17 DIAGNOSIS — I10 Essential (primary) hypertension: Secondary | ICD-10-CM

## 2024-01-17 DIAGNOSIS — E66812 Obesity, class 2: Secondary | ICD-10-CM

## 2024-01-17 DIAGNOSIS — Z1211 Encounter for screening for malignant neoplasm of colon: Secondary | ICD-10-CM

## 2024-01-17 DIAGNOSIS — Z6838 Body mass index (BMI) 38.0-38.9, adult: Secondary | ICD-10-CM

## 2024-01-17 MED ORDER — LEVOTHYROXINE SODIUM 150 MCG PO TABS: ORAL_TABLET | ORAL | 0 refills | Status: DC

## 2024-01-17 MED ORDER — ACETAMINOPHEN 500 MG PO TABS: 1000.0000 mg | ORAL_TABLET | Freq: Three times a day (TID) | ORAL | Status: AC | PRN

## 2024-01-17 NOTE — Patient Instructions (Signed)
 Dtap, pneumonia and Zoster vaccines

## 2024-01-17 NOTE — Progress Notes (Unsigned)
 [facility] clinic  Provider:  Inge Mangle DNP  Code Status:  Full Code  Goals of Care:     08/16/2015    9:30 AM  Advanced Directives  Does Patient Have a Medical Advance Directive? No   Would patient like information on creating a medical advance directive? Yes - Transport planner given      Data saved with a previous flowsheet row definition     Chief Complaint  Patient presents with   New Patient (Initial Visit)    Possible RA flair up since Saturday.    Discussed the use of AI scribe software for clinical note transcription with the patient, who gave verbal consent to proceed.  HPI: Patient is a 58 y.o. female seen today to establish care with [facility].  She has a history of hypothyroidism diagnosed at age 69, characterized by muscular weakness, hair loss, and fatigue. She has been on levothyroxine  150 mcg daily since diagnosis. If she misses her medication, she experiences significant symptoms including difficulty breathing within 4 to 7 days.  For the past year, she has experienced multiple joint pains, fatigue, and a sensation of fever under her skin. She reports stiffness and pain in her joints, particularly after physical activity, and mentions painful bumps on her wrists. Fatigue necessitates lying down, and she feels feverish without an actual fever. She takes acetaminophen  500 mg as needed for pain relief.  She has a history of osteoarthritis in her right hip and reports numbness and tingling in her fingers.  She has a history of hypertension, managed through diet and exercise, following a Mediterranean diet and taking supplements including omega-3, vitamin C, vitamin D3, coenzyme Q10, cinnamon, turmeric, and probiotics.  She smokes about half a pack of cigarettes daily and drinks alcohol very rarely, typically on holidays. She follows a healthy diet rich in fruits, vegetables, chicken, and tuna, and avoids fast food.  She has a history of a benign breast mass  that was biopsied and monitored in the past. She has not had a mammogram in several years and is due for one.  She has experienced shingles multiple times, starting at a young age, and has a history of a lymph node under her arm.     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   ascorbic acid (VITAMIN C) 500 MG tablet Take 500 mg by mouth daily.   Cholecalciferol (VITAMIN D3) 250 MCG (10000 UT) TABS Take 250 mcg by mouth daily.   Coenzyme Q10 (CO Q-10) 100 MG CAPS Take 100 mg by mouth daily.   [employer] CINNAMON PO Take 1,000 mg by mouth daily.   DHEA 50 MG TABS Take 50 mg by mouth daily.   lactobacillus acidophilus (BACID) TABS tablet Take 2 tablets by mouth daily.   Multiple Vitamins-Minerals (MULTIVITAMIN WOMEN)  TABS Take 1 tablet by mouth daily.   Turmeric (QC TUMERIC COMPLEX PO) Take 500 mg by mouth daily.   UNABLE TO FIND 2,000 mg daily. Med Name: Omega Fish Oil   UNABLE TO FIND 270 mg daily. Med Name: Magnesium gummy   [DISCONTINUED] acetaminophen  (TYLENOL ) 325 MG tablet Take 2 tablets (650 mg total) by mouth every 6 (six) hours as needed for moderate pain. (Patient taking differently: Take 500 mg by mouth as needed for moderate pain (pain score 4-6).)   [DISCONTINUED] levothyroxine  (SYNTHROID ) 150 MCG tablet TAKE 1 TABLET BY MOUTH ONCE DAILY ON  EMPTY  STOMACH   levothyroxine  (SYNTHROID ) 150 MCG  tablet TAKE 1 TABLET BY MOUTH ONCE DAILY ON  EMPTY  STOMACH   [DISCONTINUED] amLODipine  (NORVASC ) 5 MG tablet TAKE 1 TABLET BY MOUTH EVERY DAY (Patient not taking: Reported on 01/17/2024)   [DISCONTINUED] benzonatate  (TESSALON ) 100 MG capsule Take 1-2 capsules (100-200 mg total) by mouth 3 (three) times daily as needed for cough. (Patient not taking: Reported on 01/17/2024)   [DISCONTINUED] lisinopril -hydrochlorothiazide  (ZESTORETIC ) 10-12.5 MG tablet Take 1 tablet by mouth daily. (Patient not taking: Reported on 01/17/2024)   [DISCONTINUED] metoprolol  tartrate (LOPRESSOR ) 25 MG tablet Take 1 tablet (25 mg total) by mouth 2 (two) times daily. (Patient not taking: Reported on 01/17/2024)   [DISCONTINUED] penicillin v potassium (VEETID) 500 MG tablet Take 500 mg by mouth 4 (four) times daily. (Patient not taking: Reported on 01/17/2024)   No facility-administered encounter medications on file as of 01/17/2024.    Review of Systems:  Review of Systems  Constitutional:  Negative for appetite change, chills, fatigue and fever.  HENT:  Negative for congestion, hearing loss, rhinorrhea and sore throat.   Eyes: Negative.   Respiratory:  Negative for cough, shortness of breath and wheezing.   Cardiovascular:  Negative for chest pain, palpitations and leg swelling.  Gastrointestinal:  Negative for abdominal pain, constipation, diarrhea, nausea and vomiting.  Genitourinary:  Negative for dysuria.  Musculoskeletal:  Positive for arthralgias. Negative for back pain and myalgias.  Skin:  Negative for color change, rash and wound.  Neurological:  Negative for dizziness, weakness and headaches.  Psychiatric/Behavioral:  Negative for behavioral problems. The patient is not nervous/anxious.     Health Maintenance  Topic Date Due   Hepatitis C Screening  Never done   Pneumococcal Vaccine 63-2 Years old (1 of 2 - PCV) Never done   Colonoscopy  Never done   Zoster Vaccines- Shingrix (1 of 2) Never done    DTaP/Tdap/Td (3 - Td or Tdap) 07/06/2017   Cervical Cancer Screening (HPV/Pap Cotest)  08/15/2018   MAMMOGRAM  06/30/2020   INFLUENZA VACCINE  03/06/2024   HIV Screening  Completed   HPV VACCINES  Aged Out   Meningococcal B Vaccine  Aged Out   COVID-19 Vaccine  Discontinued    Physical Exam: Vitals:   01/17/24 1409  BP: 136/86  Pulse: 76  Temp: 98.1 F (36.7 C)  SpO2: 98%  Weight: 256 lb (116.1 kg)  Height: 5' 8 (1.727 m)   Body mass index is 38.92 kg/m. Physical Exam Constitutional:      General: She is not in acute distress.    Appearance: She is obese.  HENT:     Head: Normocephalic and atraumatic.     Nose: Nose normal.     Mouth/Throat:     Mouth: Mucous membranes are moist.   Eyes:     Conjunctiva/sclera: Conjunctivae normal.    Cardiovascular:  Rate and Rhythm: Normal rate and regular rhythm.  Pulmonary:     Effort: Pulmonary effort is normal.     Breath sounds: Normal breath sounds.  Abdominal:     General: Bowel sounds are normal.     Palpations: Abdomen is soft.   Musculoskeletal:        General: Normal range of motion.     Cervical back: Normal range of motion.     Comments: Arthritic deformities on fingers   Skin:    General: Skin is warm and dry.   Neurological:     General: No focal deficit present.     Mental Status: She is alert and oriented to person, place, and time.   Psychiatric:        Mood and Affect: Mood normal.        Behavior: Behavior normal.        Thought Content: Thought content normal.        Judgment: Judgment normal.     Labs reviewed: Basic Metabolic Panel: No results for input(s): NA, K, CL, CO2, GLUCOSE, BUN, CREATININE, CALCIUM, MG, PHOS, TSH in the last 8760 hours. Liver Function Tests: No results for input(s): AST, ALT, ALKPHOS, BILITOT, PROT, ALBUMIN in the last 8760 hours. No results for input(s): LIPASE, AMYLASE in the last 8760 hours. No results for input(s):  AMMONIA in the last 8760 hours. CBC: No results for input(s): WBC, NEUTROABS, HGB, HCT, MCV, PLT in the last 8760 hours. Lipid Panel: No results for input(s): CHOL, HDL, LDLCALC, TRIG, CHOLHDL, LDLDIRECT in the last 8760 hours. No results found for: HGBA1C  Procedures since last visit: No results found.  Assessment/Plan  1. Arthralgia of multiple joints -   Symptoms suggest rheumatoid arthritis or another autoimmune condition. - acetaminophen  (TYLENOL ) 500 MG tablet; Take 2 tablets (1,000 mg total) by mouth every 8 (eight) hours as needed. - CBC with Differential/Platelets; Future - Rheumatoid Factor; Future - Sedimentation Rate; Future - C-reactive Protein; Future  2. Adult hypothyroidism -  Currently on levothyroxine  150 mcg daily. Symptoms consistent with hypothyroidism if medication is missed. - TSH; Future - levothyroxine  (SYNTHROID ) 150 MCG tablet; TAKE 1 TABLET BY MOUTH ONCE DAILY ON  EMPTY  STOMACH  Dispense: 30 tablet; Refill: 0  3. Essential hypertension -  Blood pressure 136/86 mmHg, managed with diet and exercise. - Complete Metabolic Panel with eGFR; Future - Lipid panel; Future  4. Class 2 obesity with body mass index (BMI) of 38.0 to 38.9 in adult, unspecified obesity type, unspecified whether serious comorbidity present (Primary) -  Follows Mediterranean diet. Interested in weight management options including Ozempic. - Discuss weight management options after reviewing blood work.  5. Encounter to establish care -  established care with [facility]  6. Screening mammogram for breast cancer - MM 3D SCREENING MAMMOGRAM BILATERAL BREAST  7. Screening for colorectal cancer -  denies blood in stool - Cologuard  8. Screen for STD (sexually transmitted disease) - Hep C Antibody; Future  9. Screening for cervical cancer - Ambulatory referral to Obstetrics / Gynecology     Labs/tests ordered:   CBC, CMP, lipid panel, RF, ESR, CRP,  cologuard, mammogram, TSH   Return in about 1 week (around 01/24/2024).  Jaiden Dinkins Medina-Vargas, NP

## 2024-01-20 ENCOUNTER — Other Ambulatory Visit

## 2024-01-20 DIAGNOSIS — I10 Essential (primary) hypertension: Secondary | ICD-10-CM

## 2024-01-20 DIAGNOSIS — Z113 Encounter for screening for infections with a predominantly sexual mode of transmission: Secondary | ICD-10-CM

## 2024-01-20 DIAGNOSIS — E039 Hypothyroidism, unspecified: Secondary | ICD-10-CM

## 2024-01-20 DIAGNOSIS — M2550 Pain in unspecified joint: Secondary | ICD-10-CM

## 2024-01-21 ENCOUNTER — Ambulatory Visit: Payer: Self-pay | Admitting: Adult Health

## 2024-01-21 LAB — CBC WITH DIFFERENTIAL/PLATELET
Absolute Lymphocytes: 2258 {cells}/uL (ref 850–3900)
Absolute Monocytes: 497 {cells}/uL (ref 200–950)
Basophils Absolute: 43 {cells}/uL (ref 0–200)
Basophils Relative: 0.6 %
Eosinophils Absolute: 405 {cells}/uL (ref 15–500)
Eosinophils Relative: 5.7 %
HCT: 47.1 % — ABNORMAL HIGH (ref 35.0–45.0)
Hemoglobin: 15.2 g/dL (ref 11.7–15.5)
MCH: 30.7 pg (ref 27.0–33.0)
MCHC: 32.3 g/dL (ref 32.0–36.0)
MCV: 95.2 fL (ref 80.0–100.0)
MPV: 12.5 fL (ref 7.5–12.5)
Monocytes Relative: 7 %
Neutro Abs: 3898 {cells}/uL (ref 1500–7800)
Neutrophils Relative %: 54.9 %
Platelets: 191 10*3/uL (ref 140–400)
RBC: 4.95 10*6/uL (ref 3.80–5.10)
RDW: 12.4 % (ref 11.0–15.0)
Total Lymphocyte: 31.8 %
WBC: 7.1 10*3/uL (ref 3.8–10.8)

## 2024-01-21 LAB — COMPLETE METABOLIC PANEL WITHOUT GFR
AG Ratio: 2.1 (calc) (ref 1.0–2.5)
ALT: 18 U/L (ref 6–29)
AST: 15 U/L (ref 10–35)
Albumin: 4.4 g/dL (ref 3.6–5.1)
Alkaline phosphatase (APISO): 79 U/L (ref 37–153)
BUN: 18 mg/dL (ref 7–25)
CO2: 25 mmol/L (ref 20–32)
Calcium: 9.5 mg/dL (ref 8.6–10.4)
Chloride: 107 mmol/L (ref 98–110)
Creat: 0.89 mg/dL (ref 0.50–1.05)
Globulin: 2.1 g/dL (ref 1.9–3.7)
Glucose, Bld: 107 mg/dL — ABNORMAL HIGH (ref 65–99)
Potassium: 4.2 mmol/L (ref 3.5–5.3)
Sodium: 144 mmol/L (ref 135–146)
Total Bilirubin: 0.4 mg/dL (ref 0.2–1.2)
Total Protein: 6.5 g/dL (ref 6.1–8.1)

## 2024-01-21 LAB — LIPID PANEL
Cholesterol: 252 mg/dL — ABNORMAL HIGH (ref <200)
HDL: 47 mg/dL — ABNORMAL LOW (ref >=50)
LDL Cholesterol (Calc): 167 mg/dL — ABNORMAL HIGH
Non-HDL Cholesterol (Calc): 205 mg/dL — ABNORMAL HIGH (ref <130)
Total CHOL/HDL Ratio: 5.4 (calc) — ABNORMAL HIGH (ref <5.0)
Triglycerides: 227 mg/dL — ABNORMAL HIGH (ref <150)

## 2024-01-21 LAB — RHEUMATOID FACTOR: Rheumatoid fact SerPl-aCnc: <10 [IU]/mL (ref <14)

## 2024-01-21 LAB — TSH: TSH: 0.88 m[IU]/L (ref 0.40–4.50)

## 2024-01-21 LAB — SEDIMENTATION RATE: Sed Rate: 9 mm/h (ref 0–30)

## 2024-01-21 LAB — HEPATITIS C ANTIBODY: Hepatitis C Ab: NONREACTIVE

## 2024-01-21 LAB — C-REACTIVE PROTEIN: CRP: 11.7 mg/L — ABNORMAL HIGH (ref <8.0)

## 2024-01-21 NOTE — Progress Notes (Signed)
-     No anemia -   Electrolytes, liver enzymes, TSH within normal -Cholesterol, triglycerides and LDL elevated, recommending to start on statin and can discuss on follow-up visit -   Rheumatoid factor and sedimentation rate normal range  -   CRP 11.7, elevated (normal <8.0), has inflammation in the body, will discuss on follow up visit - Hep C antibody negative

## 2024-01-21 NOTE — Addendum Note (Signed)
 Addended by: Inge Mangle C on: 01/21/2024 04:46 PM   Modules accepted: Orders

## 2024-01-24 ENCOUNTER — Ambulatory Visit (INDEPENDENT_AMBULATORY_CARE_PROVIDER_SITE_OTHER): Admitting: Adult Health

## 2024-01-24 ENCOUNTER — Telehealth: Payer: Self-pay

## 2024-01-24 ENCOUNTER — Encounter: Payer: Self-pay | Admitting: Adult Health

## 2024-01-24 VITALS — BP 140/76 | HR 82 | Temp 98.6°F | Ht 68.0 in | Wt 253.2 lb

## 2024-01-24 DIAGNOSIS — E039 Hypothyroidism, unspecified: Secondary | ICD-10-CM

## 2024-01-24 DIAGNOSIS — R0683 Snoring: Secondary | ICD-10-CM

## 2024-01-24 DIAGNOSIS — R7982 Elevated C-reactive protein (CRP): Secondary | ICD-10-CM | POA: Diagnosis not present

## 2024-01-24 DIAGNOSIS — I10 Essential (primary) hypertension: Secondary | ICD-10-CM

## 2024-01-24 DIAGNOSIS — E782 Mixed hyperlipidemia: Secondary | ICD-10-CM | POA: Diagnosis not present

## 2024-01-24 DIAGNOSIS — M2550 Pain in unspecified joint: Secondary | ICD-10-CM

## 2024-01-24 DIAGNOSIS — E66812 Obesity, class 2: Secondary | ICD-10-CM

## 2024-01-24 DIAGNOSIS — Z6838 Body mass index (BMI) 38.0-38.9, adult: Secondary | ICD-10-CM

## 2024-01-24 DIAGNOSIS — Z13820 Encounter for screening for osteoporosis: Secondary | ICD-10-CM

## 2024-01-24 NOTE — Telephone Encounter (Signed)
 Message routed to the administrative team to further follow-up with patient

## 2024-01-24 NOTE — Telephone Encounter (Signed)
 Copied from CRM [id number]. Topic: General - Other >> Jan 24, 2024 10:57 AM Retta Caster wrote: Reason for CRM: Patient requesting call back on copay due to she is on medicaid should not be paying anything. She stated last on 06/16 she paid 35.00. Needs call back to clarify bd fore visit she would not have to pay anything  [phone]

## 2024-01-24 NOTE — Progress Notes (Unsigned)
 [facility] clinic  Provider:  Inge Mangle DNP  Code Status:  Full Code  Goals of Care:     08/16/2015    9:30 AM  Advanced Directives  Does Patient Have a Medical Advance Directive? No   Would patient like information on creating a medical advance directive? Yes - Transport planner given      Data saved with a previous flowsheet row definition     Chief Complaint  Patient presents with   Medical Management of Chronic Issues    1 week follow up     HPI: Patient is a 58 y.o. female seen today for an acute visit for Discussed the use of AI scribe software for clinical note transcription with the patient, who gave verbal consent to proceed.    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   ascorbic acid (VITAMIN C) 500 MG tablet Take 500 mg by mouth daily.   Cholecalciferol (VITAMIN D3) 250 MCG (10000 UT) TABS Take 250 mcg by mouth daily.   Coenzyme Q10 (CO Q-10) 100 MG CAPS Take 100 mg by mouth daily.   [employer] CINNAMON PO Take 1,000 mg by mouth daily.   DHEA 50 MG TABS Take 50 mg by mouth daily.   lactobacillus acidophilus (BACID) TABS tablet Take 2 tablets by mouth daily.   levothyroxine  (SYNTHROID ) 150 MCG tablet TAKE 1 TABLET BY MOUTH ONCE DAILY ON  EMPTY  STOMACH   Multiple Vitamins-Minerals (MULTIVITAMIN WOMEN) TABS Take 1 tablet by mouth daily.   Turmeric (QC TUMERIC COMPLEX  PO) Take 500 mg by mouth daily.   UNABLE TO FIND 2,000 mg daily. Med Name: Omega Fish Oil   UNABLE TO FIND 270 mg daily. Med Name: Magnesium gummy   No facility-administered encounter medications on file as of 01/24/2024.    Review of Systems:  Review of Systems  Constitutional:  Negative for appetite change, chills, fatigue and fever.  HENT:  Negative for congestion, hearing loss, rhinorrhea and sore throat.   Eyes: Negative.   Respiratory:  Negative for cough, shortness of breath and wheezing.   Cardiovascular:  Negative for chest pain, palpitations and leg swelling.  Gastrointestinal:  Negative for abdominal pain, constipation, diarrhea, nausea and vomiting.  Genitourinary:  Negative for dysuria.  Musculoskeletal:  Negative for arthralgias, back pain and myalgias.  Skin:  Negative for color change, rash and wound.  Neurological:  Negative for dizziness, weakness and headaches.  Psychiatric/Behavioral:  Negative for behavioral problems. The patient is not nervous/anxious.     Health Maintenance  Topic Date Due   Pneumococcal Vaccine 44-54 Years old (1 of 2 - PCV) Never done   Colonoscopy  Never done   Zoster Vaccines- Shingrix (1 of 2) Never done   DTaP/Tdap/Td (3 - Td or Tdap) 07/06/2017   Cervical Cancer  Screening (HPV/Pap Cotest)  08/15/2018   MAMMOGRAM  06/30/2020   INFLUENZA VACCINE  03/06/2024   Hepatitis C Screening  Completed   HIV Screening  Completed   HPV VACCINES  Aged Out   Meningococcal B Vaccine  Aged Out   COVID-19 Vaccine  Discontinued    Physical Exam: Vitals:   01/24/24 1421 01/24/24 1426  BP: (!) 140/68 (!) 140/76  Pulse: 82   Temp: 98.6 F (37 C)   SpO2: 95%   Weight: 253 lb 3.2 oz (114.9 kg)   Height: 5' 8 (1.727 m)    Body mass index is 38.5 kg/m. Physical Exam Constitutional:      Appearance: She is obese.  HENT:     Head: Normocephalic and atraumatic.     Nose: Nose normal.     Mouth/Throat:     Mouth: Mucous membranes are moist.    Eyes:     Conjunctiva/sclera: Conjunctivae normal.    Cardiovascular:     Rate and Rhythm: Normal rate and regular rhythm.  Pulmonary:     Effort: Pulmonary effort is normal.     Breath sounds: Normal breath sounds.  Abdominal:     General: Bowel sounds are normal.     Palpations: Abdomen is soft.   Musculoskeletal:        General: Normal range of motion.     Cervical back: Normal range of motion.   Skin:    General: Skin is warm and dry.   Neurological:     General: No focal deficit present.     Mental Status: She is alert and oriented to person, place, and time.   Psychiatric:        Mood and Affect: Mood normal.        Behavior: Behavior normal.        Thought Content: Thought content normal.        Judgment: Judgment normal.     Labs reviewed: Basic Metabolic Panel: Recent Labs    01/20/24 0808  NA 144  K 4.2  CL 107  CO2 25  GLUCOSE 107*  BUN 18  CREATININE 0.89  CALCIUM 9.5  TSH 0.88   Liver Function Tests: Recent Labs    01/20/24 0808  AST 15  ALT 18  BILITOT 0.4  PROT 6.5   No results for input(s): LIPASE, AMYLASE in the last 8760 hours. No results for input(s): AMMONIA in the last 8760 hours. CBC: Recent Labs    01/20/24 0808  WBC 7.1  NEUTROABS 3,898  HGB 15.2  HCT 47.1*  MCV 95.2  PLT 191   Lipid Panel: Recent Labs    01/20/24 0808  CHOL 252*  HDL 47*  LDLCALC 167*  TRIG 227*  CHOLHDL 5.4*   No results found for: HGBA1C  Procedures since last visit: No results found.  Assessment/Plan      Labs/tests ordered:     No follow-ups on file.  Maria Merritts Medina-Vargas, NP

## 2024-01-31 ENCOUNTER — Ambulatory Visit

## 2024-02-06 ENCOUNTER — Inpatient Hospital Stay: Admission: RE | Admit: 2024-02-06 | Source: Ambulatory Visit

## 2024-02-06 ENCOUNTER — Other Ambulatory Visit: Payer: Self-pay | Admitting: Adult Health

## 2024-02-06 DIAGNOSIS — R599 Enlarged lymph nodes, unspecified: Secondary | ICD-10-CM

## 2024-02-07 LAB — COLOGUARD

## 2024-02-19 ENCOUNTER — Other Ambulatory Visit: Payer: Self-pay | Admitting: Adult Health

## 2024-02-19 DIAGNOSIS — E039 Hypothyroidism, unspecified: Secondary | ICD-10-CM

## 2024-02-21 ENCOUNTER — Encounter: Payer: Self-pay | Admitting: Adult Health

## 2024-02-21 ENCOUNTER — Ambulatory Visit (INDEPENDENT_AMBULATORY_CARE_PROVIDER_SITE_OTHER): Admitting: Adult Health

## 2024-02-21 VITALS — BP 126/76 | HR 86 | Temp 97.6°F | Resp 18 | Ht 68.0 in | Wt 240.0 lb

## 2024-02-21 DIAGNOSIS — E039 Hypothyroidism, unspecified: Secondary | ICD-10-CM | POA: Diagnosis not present

## 2024-02-21 DIAGNOSIS — M2550 Pain in unspecified joint: Secondary | ICD-10-CM | POA: Diagnosis not present

## 2024-02-21 DIAGNOSIS — E782 Mixed hyperlipidemia: Secondary | ICD-10-CM

## 2024-02-21 DIAGNOSIS — I10 Essential (primary) hypertension: Secondary | ICD-10-CM | POA: Diagnosis not present

## 2024-02-21 DIAGNOSIS — Z720 Tobacco use: Secondary | ICD-10-CM

## 2024-02-21 NOTE — Progress Notes (Signed)
 [facility] clinic  Provider:  Jereld Serum DNP  Code Status:  Full Code  Goals of Care:     08/16/2015    9:30 AM  Advanced Directives  Does Patient Have a Medical Advance Directive? No   Would patient like information on creating a medical advance directive? Yes - Transport planner given      Data saved with a previous flowsheet row definition     Chief Complaint  Patient presents with   Medical Management of Chronic Issues    4 weeks, Patient did the Cologuard and did it on yesterday, Mammogram was done but just waiting from former doctor. Patient will call the [facility] to make an appointment for the Cervical Cancer Screening.   Discussed the use of AI scribe software for clinical note transcription with the patient, who gave verbal consent to proceed.   HPI: Patient is a 58 y.o. female seen today for a 4-week follow up of chronic medical issues.  She has lost 13 pounds in the past four weeks and 16 pounds since January 11, 2024, through dietary changes. Her diet includes avoiding processed foods and incorporating vegetables like squash, zucchini, and cucumber, as well as smoothies. She has limited her intake of pizza and cupcakes to two slices and one cupcake for the entire month.  Her blood pressure is currently 126/76 mmHg, and she recalls it was previously 142 mmHg. She is not taking any antihypertensive medications and reports that her blood pressure has come down with diet alone.  She experiences multiple joint pain, particularly in her hands, but notes a reduction in inflammation and pain since changing her diet. No fever is present.  She has hypothyroidism and takes levothyroxine  150 micrograms daily. Her last TSH level was normal. She has also undergone lipid testing today and is awaiting results. She does not take any medication for her mixed hyperlipidemia.  She has sent a second Cologuard test after the first was damaged in the mail and is  awaiting results.  She reports improved mood and cognitive function, stating that 'the brain fog is gone' and that her mood is more stable with her current diet. She has also joined a gym to incorporate exercise into her routine.  She smokes half a pack of cigarettes daily and has stopped drinking alcohol to avoid inflammation. She plans to quit smoking as her next health goal.    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   ascorbic acid (VITAMIN C) 500 MG tablet Take 500 mg by mouth daily.   Cholecalciferol (VITAMIN D3) 250 MCG (10000 UT) TABS Take 250 mcg by mouth daily.   Coenzyme Q10 (CO Q-10) 100 MG CAPS Take 100 mg by mouth daily.   [employer] CINNAMON PO Take 1,000 mg by mouth daily.  DHEA 50 MG TABS Take 50 mg by mouth daily.   lactobacillus acidophilus (BACID) TABS tablet Take 2 tablets by mouth daily.   levothyroxine  (SYNTHROID ) 150 MCG tablet TAKE 1 TABLET BY MOUTH ONCE DAILY ON AN EMPTY STOMACH   Multiple Vitamins-Minerals (MULTIVITAMIN WOMEN) TABS Take 1 tablet by mouth daily.   Turmeric (QC TUMERIC COMPLEX PO) Take 500 mg by mouth daily.   UNABLE TO FIND 2,000 mg daily. Med Name: Omega Fish Oil   UNABLE TO FIND 270 mg daily. Med Name: Magnesium gummy   No facility-administered encounter medications on file as of 02/21/2024.    Review of Systems:  Review of Systems  Constitutional:   Negative for appetite change, chills, fatigue and fever.  HENT:  Negative for congestion, hearing loss, rhinorrhea and sore throat.   Eyes: Negative.   Respiratory:  Negative for cough, shortness of breath and wheezing.   Cardiovascular:  Negative for chest pain, palpitations and leg swelling.  Gastrointestinal:  Negative for abdominal pain, constipation, diarrhea, nausea and vomiting.  Genitourinary:  Negative for dysuria.  Musculoskeletal:  Negative for arthralgias, back pain and myalgias.  Skin:  Negative for color change, rash and wound.  Neurological:  Negative for dizziness, weakness and headaches.  Psychiatric/Behavioral:  Negative for behavioral problems. The patient is not nervous/anxious.     Health Maintenance  Topic Date Due   Pneumococcal Vaccine 61-21 Years old (1 of 2 - PCV) Never done   Colonoscopy  Never done   Zoster Vaccines- Shingrix (1 of 2) Never done   DTaP/Tdap/Td (3 - Td or Tdap) 07/06/2017   Cervical Cancer Screening (HPV/Pap Cotest)  08/15/2018   MAMMOGRAM  06/30/2020   INFLUENZA VACCINE  03/06/2024   Hepatitis C Screening  Completed   HIV Screening  Completed   Hepatitis B Vaccines  Aged Out   HPV VACCINES  Aged Out   Meningococcal B Vaccine  Aged Out   COVID-19 Vaccine  Discontinued    Physical Exam: Vitals:   02/21/24 1359  BP: 126/76  Pulse: 86  Resp: 18  Temp: 97.6 F (36.4 C)  SpO2: 98%  Weight: 240 lb (108.9 kg)  Height: 5' 8 (1.727 m)   Body mass index is 36.49 kg/m. Physical Exam Constitutional:      General: She is not in acute distress.    Appearance: She is obese.  HENT:     Head: Normocephalic and atraumatic.     Nose: Nose normal.     Mouth/Throat:     Mouth: Mucous membranes are moist.  Eyes:     Conjunctiva/sclera: Conjunctivae normal.  Cardiovascular:     Rate and Rhythm: Normal rate and regular rhythm.  Pulmonary:     Effort: Pulmonary effort is normal.     Breath sounds: Normal breath sounds.  Abdominal:      General: Bowel sounds are normal.     Palpations: Abdomen is soft.  Musculoskeletal:        General: Normal range of motion.     Cervical back: Normal range of motion.  Skin:    General: Skin is warm and dry.  Neurological:     General: No focal deficit present.     Mental Status: She is alert and oriented to person, place, and time.  Psychiatric:        Mood and Affect: Mood normal.        Behavior: Behavior normal.        Thought Content: Thought content normal.  Judgment: Judgment normal.     Labs reviewed: Basic Metabolic Panel: Recent Labs    01/20/24 0808  NA 144  K 4.2  CL 107  CO2 25  GLUCOSE 107*  BUN 18  CREATININE 0.89  CALCIUM 9.5  TSH 0.88   Liver Function Tests: Recent Labs    01/20/24 0808  AST 15  ALT 18  BILITOT 0.4  PROT 6.5   No results for input(s): LIPASE, AMYLASE in the last 8760 hours. No results for input(s): AMMONIA in the last 8760 hours. CBC: Recent Labs    01/20/24 0808  WBC 7.1  NEUTROABS 3,898  HGB 15.2  HCT 47.1*  MCV 95.2  PLT 191   Lipid Panel: Recent Labs    01/20/24 0808  CHOL 252*  HDL 47*  LDLCALC 167*  TRIG 227*  CHOLHDL 5.4*   No results found for: HGBA1C  Procedures since last visit: No results found.  Assessment/Plan  1. Essential hypertension (Primary) -   well-controlled with diet. Blood pressure improved to 126/76 mmHg. - Continue current dietary modifications to manage blood pressure. - Monitor blood pressure regularly at home. - Complete Metabolic Panel with eGFR  2. Arthralgia of multiple joints -  Joint pain improved with dietary changes. Inflammation decreased. Aware of potential symptom exacerbation with certain foods. - Continue current dietary modifications to manage joint pain. - Follow up with rheumatologist in November.  3. Adult hypothyroidism Lab Results  Component Value Date   TSH 0.88 01/20/2024    -  managed with levothyroxine . TSH levels normal. -  Continue levothyroxine  150 mcg daily.  4. Mixed hyperlipidemia Lab Results  Component Value Date   CHOL 252 (H) 01/20/2024   HDL 47 (L) 01/20/2024   LDLCALC 167 (H) 01/20/2024   TRIG 227 (H) 01/20/2024   CHOLHDL 5.4 (H) 01/20/2024    -  not on statin -  Lipid panel checked today. - Review lipid panel results when available - Lipid panel  5. Tobacco use -  smoking cessation discussed    General Health Maintenance Engaged in lifestyle modifications. Joined gym, quit alcohol, reducing smoking. - Encourage continued dietary modifications and exercise. - Support smoking cessation efforts.  Follow-up Scheduled follow-up in three months. Awaiting Cologuard test results and diagnostic breast screening. - Schedule follow-up appointment in three months. - Await Cologuard test results. - Complete diagnostic breast screening.      Labs/tests ordered:   lipid panel and CMP   Return in about 3 months (around 05/23/2024).  Leyani Gargus Medina-Vargas, NP

## 2024-02-22 LAB — LIPID PANEL
Cholesterol: 197 mg/dL (ref <200)
HDL: 37 mg/dL — ABNORMAL LOW (ref >=50)
LDL Cholesterol (Calc): 130 mg/dL — ABNORMAL HIGH
Non-HDL Cholesterol (Calc): 160 mg/dL — ABNORMAL HIGH (ref <130)
Total CHOL/HDL Ratio: 5.3 (calc) — ABNORMAL HIGH (ref <5.0)
Triglycerides: 162 mg/dL — ABNORMAL HIGH (ref <150)

## 2024-02-22 LAB — COMPREHENSIVE METABOLIC PANEL WITH GFR
AG Ratio: 1.6 (calc) (ref 1.0–2.5)
ALT: 22 U/L (ref 6–29)
AST: 18 U/L (ref 10–35)
Albumin: 4.1 g/dL (ref 3.6–5.1)
Alkaline phosphatase (APISO): 72 U/L (ref 37–153)
BUN: 20 mg/dL (ref 7–25)
CO2: 24 mmol/L (ref 20–32)
Calcium: 9.5 mg/dL (ref 8.6–10.4)
Chloride: 105 mmol/L (ref 98–110)
Creat: 0.73 mg/dL (ref 0.50–1.05)
Globulin: 2.6 g/dL (ref 1.9–3.7)
Glucose, Bld: 85 mg/dL (ref 65–99)
Potassium: 4.2 mmol/L (ref 3.5–5.3)
Sodium: 140 mmol/L (ref 135–146)
Total Bilirubin: 0.3 mg/dL (ref 0.2–1.2)
Total Protein: 6.7 g/dL (ref 6.1–8.1)
eGFR: 94 mL/min/1.73m2 (ref >=60)

## 2024-02-23 LAB — COLOGUARD

## 2024-02-27 ENCOUNTER — Ambulatory Visit: Payer: Self-pay | Admitting: Nurse Practitioner

## 2024-03-17 ENCOUNTER — Other Ambulatory Visit: Payer: Self-pay | Admitting: Adult Health

## 2024-03-17 ENCOUNTER — Ambulatory Visit
Admission: RE | Admit: 2024-03-17 | Discharge: 2024-03-17 | Disposition: A | Discharge status: Home / Self Care | Source: Ambulatory Visit | Attending: Adult Health | Admitting: Adult Health

## 2024-03-17 DIAGNOSIS — R599 Enlarged lymph nodes, unspecified: Secondary | ICD-10-CM

## 2024-03-23 ENCOUNTER — Ambulatory Visit: Payer: Self-pay

## 2024-03-23 ENCOUNTER — Encounter: Payer: Self-pay | Admitting: Family

## 2024-03-23 ENCOUNTER — Ambulatory Visit (INDEPENDENT_AMBULATORY_CARE_PROVIDER_SITE_OTHER): Admitting: Family

## 2024-03-23 VITALS — BP 132/76 | HR 88 | Temp 97.8°F | Resp 19 | Ht 68.0 in | Wt 232.0 lb

## 2024-03-23 DIAGNOSIS — H60332 Swimmer's ear, left ear: Secondary | ICD-10-CM

## 2024-03-23 DIAGNOSIS — L989 Disorder of the skin and subcutaneous tissue, unspecified: Secondary | ICD-10-CM

## 2024-03-23 DIAGNOSIS — E66812 Obesity, class 2: Secondary | ICD-10-CM

## 2024-03-23 DIAGNOSIS — Z6838 Body mass index (BMI) 38.0-38.9, adult: Secondary | ICD-10-CM | POA: Diagnosis not present

## 2024-03-23 MED ORDER — CIPROFLOXACIN-DEXAMETHASONE 0.3-0.1 % OT SUSP: 4.0000 [drp] | Freq: Two times a day (BID) | OTIC | 0 refills | Status: AC

## 2024-03-23 MED ORDER — IBUPROFEN 600 MG PO TABS: 600.0000 mg | ORAL_TABLET | Freq: Three times a day (TID) | ORAL | 0 refills | Status: AC | PRN

## 2024-03-23 NOTE — Progress Notes (Signed)
 Provider: Roxan Tawyna Pellot FNP-C  Medina-Vargas, Monina C, NP  Patient Care Team: Phyllis Jereld BROCKS, NP as PCP - General (Internal Medicine)  Extended Emergency Contact Information Primary Emergency Contact: Ronan,Medina  [place] Mobile Phone: [phone] Relation: Mother  Code Status:  Full Code  Goals of care: Advanced Directive information    03/23/2024    3:11 PM  Advanced Directives  Does Patient Have a Medical Advance Directive? Yes  Type of Estate agent of [place];Living will  Does patient want to make changes to medical advance directive? No - Patient declined  Copy of Healthcare Power of Attorney in Chart? No - copy requested     Chief Complaint  Patient presents with   Ear Pain    Left ear pain.   Discussed the use of AI scribe software for clinical note transcription with the patient, who gave verbal consent to proceed.  History of Present Illness   Maria Robles is a 58 year old female who presents with left ear pain.  She woke up with pain in her left ear, which she describes as possibly related to her jaw or pressure from the ear hitting the jaw. The pain radiates upwards towards her head. She has not taken any medication for the pain yet.  She has a history of jaw trauma from college volleyball, which caused a similar sensation of the jaw being pushed into the ear. She has not used ear drops recently but performed earwax treatments in March or April. She occasionally feels water draining from her ears after swimming. She recalls frequent ear infections as a child due to competitive swimming but has not had any recent infections.  She has been actively losing weight since June, having lost 26 pounds, and exercises daily. Her weight today is 232 pounds, down from 256 pounds in June. She follows a diet consisting of vegetables, fruits, and clean protein.  No fever, chills, sore throat, or cold symptoms.     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   Cholecalciferol (VITAMIN D3) 250 MCG (10000 UT) TABS Take 250 mcg by mouth daily.   ciprofloxacin -dexamethasone  (CIPRODEX ) OTIC suspension Place 4 drops into the left ear 2 (two) times daily for 7 days.   Coenzyme Q10 (CO Q-10) 100 MG CAPS Take 100 mg by mouth daily.   [employer] CINNAMON PO Take 1,000 mg by mouth daily.   DHEA 50 MG TABS Take 50 mg by mouth daily.   ibuprofen  (ADVIL ) 600 MG tablet Take 1 tablet (600 mg total) by mouth every 8 (eight) hours as needed.   lactobacillus acidophilus (BACID) TABS tablet Take 2 tablets by mouth daily.   levothyroxine  (SYNTHROID ) 150 MCG tablet TAKE 1 TABLET BY MOUTH ONCE DAILY ON AN EMPTY STOMACH   Multiple Vitamins-Minerals (MULTIVITAMIN WOMEN) TABS Take 1 tablet by  mouth daily.   Turmeric (QC TUMERIC COMPLEX PO) Take 500 mg by mouth daily.   UNABLE TO FIND 2,000 mg daily. Med Name: Omega Fish Oil   UNABLE TO FIND 270 mg daily. Med Name: Magnesium gummy   acetaminophen  (TYLENOL ) 500 MG tablet Take 2 tablets (1,000 mg total) by mouth every 8 (eight) hours as needed.   No facility-administered encounter medications on file as of 03/23/2024.    Review of Systems  Constitutional:  Negative for appetite change, chills, fatigue, fever and unexpected weight change.  HENT:  Positive for ear pain. Negative for congestion, dental problem, ear discharge, facial  swelling, hearing loss, nosebleeds, postnasal drip, rhinorrhea, sinus pressure, sinus pain, sneezing, sore throat, tinnitus and trouble swallowing.   Eyes:  Negative for pain, discharge, redness, itching and visual disturbance.  Respiratory:  Negative for cough, chest tightness, shortness of breath and wheezing.   Cardiovascular:  Negative for chest pain, palpitations and leg swelling.  Gastrointestinal:  Negative for abdominal distention, abdominal pain, diarrhea, nausea and vomiting.  Musculoskeletal:  Negative for arthralgias, back pain, gait problem, joint swelling and myalgias.  Skin:  Negative for color change, pallor, rash and wound.       Left upper back mass   Neurological:  Negative for dizziness, light-headedness and headaches.  Hematological:  Does not bruise/bleed easily.    Immunization History  Administered Date(s) Administered   DTaP 07/07/2007   Influenza-Unspecified 05/26/2015   PFIZER(Purple Top)SARS-COV-2 Vaccination 11/05/2019, 11/30/2019   Tdap 07/07/2007   Pertinent  Health Maintenance Due  Topic Date Due   Colonoscopy  Never done   INFLUENZA VACCINE  03/06/2024   MAMMOGRAM  03/17/2026      03/21/2018    3:11 PM 01/17/2024    1:54 PM 03/23/2024    3:11 PM  Fall Risk  Falls in the past year? No  0 0  Was there an injury with Fall?  0 0  Fall Risk Category Calculator  0 0  Patient at Risk for Falls Due to  No Fall Risks No Fall Risks  Fall risk Follow up  Falls evaluation completed Falls evaluation completed     Data saved with a previous flowsheet row definition   Functional Status Survey:    Vitals:   03/23/24 1512  BP: 132/76  Pulse: 88  Resp: 19  Temp: 97.8 F (36.6 C)  SpO2: 96%  Weight: 232 lb (105.2 kg)  Height: 5' 8 (1.727 m)   Body mass index is 35.28 kg/m. Physical Exam  VITALS: T- 97.8, BP- 132/76, SaO2- 96% MEASUREMENTS: Weight- 232. GENERAL: Alert, cooperative, well developed, no acute distress. HEENT: Normocephalic, normal  oropharynx, moist mucous membranes. External ear normal, no redness. Tympanic membrane normal, no redness. Teeth without signs of infection. NECK: Left cervical lymphadenopathy. CHEST: Clear to auscultation bilaterally, no wheezes, rhonchi, or crackles. CARDIOVASCULAR: Normal heart rate and rhythm, S1 and S2 normal without murmurs. ABDOMEN: Soft, non-tender, non-distended, without organomegaly, normal bowel sounds. EXTREMITIES: No cyanosis or edema. NEUROLOGICAL: Cranial nerves grossly intact, moves all extremities without gross motor or sensory deficit. SKIN: Subcutaneous nodule on back, firm, rubbery, larger than a pea.   Labs reviewed: Recent Labs    01/20/24 0808 02/21/24 1329  NA 144 140  K 4.2 4.2  CL 107 105  CO2 25 24  GLUCOSE 107* 85  BUN 18 20  CREATININE 0.89 0.73  CALCIUM 9.5 9.5   Recent Labs    01/20/24 0808 02/21/24 1329  AST 15 18  ALT  18 22  BILITOT 0.4 0.3  PROT 6.5 6.7   Recent Labs    01/20/24 0808  WBC 7.1  NEUTROABS 3,898  HGB 15.2  HCT 47.1*  MCV 95.2  PLT 191   Lab Results  Component Value Date   TSH 0.88 01/20/2024   No results found for: HGBA1C Lab Results  Component Value Date   CHOL 197 02/21/2024   HDL 37 (L) 02/21/2024   LDLCALC 130 (H) 02/21/2024   TRIG 162 (H) 02/21/2024   CHOLHDL 5.3 (H) 02/21/2024    Significant Diagnostic Results in last 30 days:  MM 3D DIAGNOSTIC MAMMOGRAM BILATERAL BREAST Result Date: 03/17/2024 CLINICAL DATA:  58 year old with a possible palpable lump involving the RIGHT axilla which is intermittently tender. Annual mammographic evaluation. EXAM: DIGITAL DIAGNOSTIC BILATERAL MAMMOGRAM WITH TOMOSYNTHESIS AND CAD; ULTRASOUND LEFT BREAST LIMITED; US  AXILLARY RIGHT TECHNIQUE: Bilateral digital diagnostic mammography and breast tomosynthesis was performed. The images were evaluated with computer-aided detection. ; Targeted ultrasound examination of the left breast was performed.; Targeted ultrasound  examination of the right axilla was performed. COMPARISON:  Previous exam(s). ACR Breast Density Category a: The breasts are almost entirely fatty. FINDINGS: Full field CC and MLO views of both breasts and a spot tangential view of the area of concern in the RIGHT axilla were obtained. RIGHT: No findings suspicious for malignancy. Prominent axillary fat pad in the area of clinical concern; no underlying mass or pathologic lymphadenopathy. Targeted ultrasound is performed in the axilla, demonstrating a mildly prominent axillary fat pad. There is no mass or pathologic lymphadenopathy. On correlate physical examination, the area of clinical concern indeed corresponds to the axillary fat pad. There is no palpable mass. LEFT: New circumscribed isodense mass with possible central fat superficially in the LOWER OUTER QUADRANT at middle depth, measuring approximately 0.5 cm. No associated architectural distortion or suspicious calcifications. No suspicious findings elsewhere. Targeted ultrasound is performed, demonstrating a circumscribed oval parallel anechoic mass with a thin internal septation at 4 o'clock 7 cm from the nipple at anterior to middle depth measuring approximately 0.5 x 0.3 x 0.5 cm, demonstrating posterior acoustic enhancement and no internal power Doppler flow, corresponding to the mammographic finding. No suspicious solid mass or abnormal acoustic shadowing is identified. Sonographic evaluation of the axilla demonstrates no pathologic lymphadenopathy. IMPRESSION: 1. No mammographic or sonographic evidence of malignancy involving the LEFT breast. 2. No mammographic evidence of malignancy involving the RIGHT breast. 3. Mildly prominent RIGHT axillary fat pad accounts for the palpable concern. 4. No pathologic lymphadenopathy involving either the RIGHT or LEFT axilla. RECOMMENDATION: Screening mammogram in one year.(Code:SM-B-01Y) I have discussed the findings and recommendations with the patient. If  applicable, a reminder letter will be sent to the patient regarding the next appointment. BI-RADS CATEGORY  2: Benign. Electronically Signed   By: Debby Satterfield M.D.   On: 03/17/2024 13:37   US  AXILLA RIGHT Result Date: 03/17/2024 CLINICAL DATA:  58 year old with a possible palpable lump involving the RIGHT axilla which is intermittently tender. Annual mammographic evaluation. EXAM: DIGITAL DIAGNOSTIC BILATERAL MAMMOGRAM WITH TOMOSYNTHESIS AND CAD; ULTRASOUND LEFT BREAST LIMITED; US  AXILLARY RIGHT TECHNIQUE: Bilateral digital diagnostic mammography and breast tomosynthesis was performed. The images were evaluated with computer-aided detection. ; Targeted ultrasound examination of the left breast was performed.; Targeted ultrasound examination of the right axilla was performed. COMPARISON:  Previous exam(s). ACR Breast Density Category a: The breasts are almost entirely fatty. FINDINGS: Full field CC and MLO views of both breasts and a spot tangential  view of the area of concern in the RIGHT axilla were obtained. RIGHT: No findings suspicious for malignancy. Prominent axillary fat pad in the area of clinical concern; no underlying mass or pathologic lymphadenopathy. Targeted ultrasound is performed in the axilla, demonstrating a mildly prominent axillary fat pad. There is no mass or pathologic lymphadenopathy. On correlate physical examination, the area of clinical concern indeed corresponds to the axillary fat pad. There is no palpable mass. LEFT: New circumscribed isodense mass with possible central fat superficially in the LOWER OUTER QUADRANT at middle depth, measuring approximately 0.5 cm. No associated architectural distortion or suspicious calcifications. No suspicious findings elsewhere. Targeted ultrasound is performed, demonstrating a circumscribed oval parallel anechoic mass with a thin internal septation at 4 o'clock 7 cm from the nipple at anterior to middle depth measuring approximately 0.5 x 0.3  x 0.5 cm, demonstrating posterior acoustic enhancement and no internal power Doppler flow, corresponding to the mammographic finding. No suspicious solid mass or abnormal acoustic shadowing is identified. Sonographic evaluation of the axilla demonstrates no pathologic lymphadenopathy. IMPRESSION: 1. No mammographic or sonographic evidence of malignancy involving the LEFT breast. 2. No mammographic evidence of malignancy involving the RIGHT breast. 3. Mildly prominent RIGHT axillary fat pad accounts for the palpable concern. 4. No pathologic lymphadenopathy involving either the RIGHT or LEFT axilla. RECOMMENDATION: Screening mammogram in one year.(Code:SM-B-01Y) I have discussed the findings and recommendations with the patient. If applicable, a reminder letter will be sent to the patient regarding the next appointment. BI-RADS CATEGORY  2: Benign. Electronically Signed   By: Debby Satterfield M.D.   On: 03/17/2024 13:37   US  LIMITED ULTRASOUND INCLUDING AXILLA LEFT BREAST  Result Date: 03/17/2024 CLINICAL DATA:  58 year old with a possible palpable lump involving the RIGHT axilla which is intermittently tender. Annual mammographic evaluation. EXAM: DIGITAL DIAGNOSTIC BILATERAL MAMMOGRAM WITH TOMOSYNTHESIS AND CAD; ULTRASOUND LEFT BREAST LIMITED; US  AXILLARY RIGHT TECHNIQUE: Bilateral digital diagnostic mammography and breast tomosynthesis was performed. The images were evaluated with computer-aided detection. ; Targeted ultrasound examination of the left breast was performed.; Targeted ultrasound examination of the right axilla was performed. COMPARISON:  Previous exam(s). ACR Breast Density Category a: The breasts are almost entirely fatty. FINDINGS: Full field CC and MLO views of both breasts and a spot tangential view of the area of concern in the RIGHT axilla were obtained. RIGHT: No findings suspicious for malignancy. Prominent axillary fat pad in the area of clinical concern; no underlying mass or pathologic  lymphadenopathy. Targeted ultrasound is performed in the axilla, demonstrating a mildly prominent axillary fat pad. There is no mass or pathologic lymphadenopathy. On correlate physical examination, the area of clinical concern indeed corresponds to the axillary fat pad. There is no palpable mass. LEFT: New circumscribed isodense mass with possible central fat superficially in the LOWER OUTER QUADRANT at middle depth, measuring approximately 0.5 cm. No associated architectural distortion or suspicious calcifications. No suspicious findings elsewhere. Targeted ultrasound is performed, demonstrating a circumscribed oval parallel anechoic mass with a thin internal septation at 4 o'clock 7 cm from the nipple at anterior to middle depth measuring approximately 0.5 x 0.3 x 0.5 cm, demonstrating posterior acoustic enhancement and no internal power Doppler flow, corresponding to the mammographic finding. No suspicious solid mass or abnormal acoustic shadowing is identified. Sonographic evaluation of the axilla demonstrates no pathologic lymphadenopathy. IMPRESSION: 1. No mammographic or sonographic evidence of malignancy involving the LEFT breast. 2. No mammographic evidence of malignancy involving the RIGHT breast. 3. Mildly prominent RIGHT axillary  fat pad accounts for the palpable concern. 4. No pathologic lymphadenopathy involving either the RIGHT or LEFT axilla. RECOMMENDATION: Screening mammogram in one year.(Code:SM-B-01Y) I have discussed the findings and recommendations with the patient. If applicable, a reminder letter will be sent to the patient regarding the next appointment. BI-RADS CATEGORY  2: Benign. Electronically Signed   By: Debby Satterfield M.D.   On: 03/17/2024 13:37    Assessment/Plan  Left jaw and swimmer's ear pain with possible early infection Pain in the left jaw and ear, possibly due to early infection. No fever, but she reports feeling as if a fever might develop. No redness or signs of  infection on the eardrum. Swollen lymph node on the left side, indicating possible early infection. History of ear infections as a child, especially due to swimming. No current signs of dental infection. - Prescribe Ciprodex  ear drops, 4 drops in the left ear twice a day for 5-7 days. - Prescribe ibuprofen  for pain management, ensuring to take with food to avoid stomach irritation. - Advise to monitor for fever or worsening symptoms and to report if symptoms do not improve after using drops. - Discuss potential need for oral antibiotics if symptoms worsen or do not improve.  Benign subcutaneous mass of back Hard, rubbery subcutaneous mass on the back, larger than a pea, non-tender. Likely a benign cyst or lipoma. No immediate concern for malignancy. - Refer to general surgery for evaluation and potential excision of the mass.  Obesity She has been actively losing weight since June, with a total loss of 26 pounds. Engaged in daily exercise and dietary changes focusing on vegetables, fruits, and clean protein. Blood pressure slightly elevated but improving with weight loss. - Continue current exercise regimen and dietary changes. - Monitor blood pressure and encourage continued weight loss.   Family/ staff Communication: Reviewed plan of care with patient verbalized understanding   Labs/tests ordered: None   Next Appointment: Return if symptoms worsen or fail to improve.   Total time: 29 minutes. Greater than 50% of total time spent doing patient education regarding left ear pain,left upper back mass, Obesity,health maintenance including symptom/medication management.   Roxan JAYSON Plough, NP

## 2024-03-23 NOTE — Telephone Encounter (Signed)
   FYI Only or Action Required?: FYI only for provider.  Patient was last seen in primary care on 02/21/2024 by Medina-Vargas, Jereld BROCKS, NP.  Called Nurse Triage reporting Otalgia.  Symptoms began today.  Interventions attempted: Nothing.  Symptoms are: gradually worsening.  Triage Disposition: See Physician Within 24 Hours  Patient/caregiver understands and will follow disposition?: Yes                             Copied from CRM #[id number]. Topic: Clinical - Red Word Triage >> Mar 23, 2024  2:35 PM Mercer PEDLAR wrote: Red Word that prompted transfer to Nurse Triage: Ear pain. Reason for Disposition  Earache  (Exceptions: Brief ear pain of lasting less than 60 minutes, or earache occurring during air travel.)  Answer Assessment - Initial Assessment Questions 1. LOCATION: Which ear is involved?     Left ear and along jaw 2. ONSET: When did the ear pain start?      Today  3. SEVERITY: How bad is the pain?  (Scale 1-10; mild, moderate or severe)     Rates pain a 3  4. URI SYMPTOMS: Do you have a runny nose or cough?     Denies 5. FEVER: Do you have a fever? If Yes, ask: What is your temperature, how was it measured, and when did it start?     Denies 6. CAUSE: Have you been swimming recently?, How often do you use Q-TIPS?, Have you had any recent air travel or scuba diving?     States she went swimming today  7. OTHER SYMPTOMS: Do you have any other symptoms? (e.g., decreased hearing, dizziness, headache, stiff neck, vomiting)     Headache along left temple, denies decreased hearing, denies dizziness, denies drainage  Protocols used: Earache-A-AH

## 2024-03-23 NOTE — Telephone Encounter (Signed)
 Appointment scheduled for 03/23/2024

## 2024-03-30 LAB — COLOGUARD: COLOGUARD: NEGATIVE

## 2024-05-25 ENCOUNTER — Ambulatory Visit: Admitting: Adult Health

## 2024-05-26 ENCOUNTER — Ambulatory Visit: Admitting: Sports Medicine

## 2024-06-04 ENCOUNTER — Encounter: Payer: Self-pay | Admitting: Adult Health

## 2024-06-04 ENCOUNTER — Ambulatory Visit (INDEPENDENT_AMBULATORY_CARE_PROVIDER_SITE_OTHER): Admitting: Adult Health

## 2024-06-04 VITALS — BP 124/62 | HR 70 | Temp 98.1°F | Resp 18 | Ht 68.0 in | Wt 211.8 lb

## 2024-06-04 DIAGNOSIS — E039 Hypothyroidism, unspecified: Secondary | ICD-10-CM | POA: Diagnosis not present

## 2024-06-04 DIAGNOSIS — R200 Anesthesia of skin: Secondary | ICD-10-CM

## 2024-06-04 DIAGNOSIS — E66811 Obesity, class 1: Secondary | ICD-10-CM

## 2024-06-04 DIAGNOSIS — Z720 Tobacco use: Secondary | ICD-10-CM

## 2024-06-04 DIAGNOSIS — Z6832 Body mass index (BMI) 32.0-32.9, adult: Secondary | ICD-10-CM

## 2024-06-04 MED ORDER — VARENICLINE TARTRATE 0.5 MG PO TABS: 0.5000 mg | ORAL_TABLET | Freq: Two times a day (BID) | ORAL | 3 refills | Status: AC

## 2024-06-04 MED ORDER — VARENICLINE TARTRATE 0.5 MG PO TABS: 0.5000 mg | ORAL_TABLET | Freq: Every day | ORAL | 0 refills | Status: AC

## 2024-06-04 NOTE — Progress Notes (Signed)
 [facility]  Provider:  Jereld Serum DNP  Code Status:  Full Code  Goals of Care:     03/23/2024    3:11 PM  Advanced Directives  Does Patient Have a Medical Advance Directive? Yes  Type of Estate Agent of [place];Living will  Does patient want to make changes to medical advance directive? No - Patient declined  Copy of Healthcare Power of Attorney in Chart? No - copy requested     Chief Complaint  Patient presents with   Medical Management of Chronic Issues    3 month f/u needs to discuss tetanus vaccine     Discussed the use of AI scribe software for clinical note transcription with the patient, who gave verbal consent to proceed.  HPI: Patient is a 58 y.o. female seen today for a 8-month follow up of chronic medical issues.   She experiences numbness and tingling in her fingers, described as 'bentonite'. She has not yet seen a rheumatologist, with an appointment scheduled for November 20th.  She engages in a daily swimming routine at the [facility], starting at 300 meters and increasing to 2000 meters. This has resulted in a weight loss from 256 pounds to 211 pounds since June 18th, a total of 45 pounds. Her blood pressure has decreased from 156/62 to 124/62 without medication.  She has a history of hypothyroidism, managed with levothyroxine  150 mcg daily. Her TSH level was 0.88 as of January 20, 2024, stable compared to a previous high of over 300.  She smokes approximately a pack of cigarettes a day and has been doing so for 45 years. She wants to quit smoking and plans to use Chantix.  Her diet includes fruit, a tomato-cucumber-pepper smoothie for breakfast, and a protein powder with avocado, yogurt, banana, and collagen in the afternoon. She feels '58 years old' and notes improved strength and mobility.  She completed a Cologuard test on March 24, 2024, which was negative.    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   ascorbic acid (VITAMIN C) 500 MG tablet Take 500 mg by mouth daily.   Cholecalciferol (VITAMIN D3) 250 MCG (10000 UT) TABS Take 250 mcg by mouth daily.   Coenzyme Q10 (CO Q-10) 100 MG CAPS Take 100 mg by mouth daily.   [employer] CINNAMON PO Take 1,000 mg by mouth daily.   DHEA 50 MG TABS Take 50 mg by mouth daily.   ibuprofen  (ADVIL ) 600 MG tablet Take 1 tablet (600 mg total) by mouth every 8 (eight) hours as needed.   lactobacillus acidophilus (BACID) TABS tablet Take 2 tablets by mouth daily.   levothyroxine  (SYNTHROID ) 150 MCG tablet TAKE 1 TABLET BY MOUTH ONCE DAILY ON AN EMPTY STOMACH   Multiple Vitamins-Minerals (MULTIVITAMIN WOMEN) TABS Take 1 tablet  by mouth daily.   Turmeric (QC TUMERIC COMPLEX PO) Take 500 mg by mouth daily.   UNABLE TO FIND 2,000 mg daily. Med Name: Omega Fish Oil   UNABLE TO FIND 270 mg daily. Med Name: Magnesium gummy   varenicline (CHANTIX) 0.5 MG tablet Take 1 tablet (0.5 mg total) by mouth daily for 3 days. Then take 1 tablet twice a day   [START ON 06/07/2024] varenicline (CHANTIX) 0.5 MG tablet Take 1 tablet (0.5 mg total) by mouth 2 (two) times daily.   No facility-administered encounter medications on file as of 06/04/2024.    Review of Systems:  Review of Systems  Constitutional:  Negative for appetite change, chills, fatigue and fever.  HENT:  Negative for  congestion, hearing loss, rhinorrhea and sore throat.   Eyes: Negative.   Respiratory:  Negative for cough, shortness of breath and wheezing.   Cardiovascular:  Negative for chest pain, palpitations and leg swelling.  Gastrointestinal:  Negative for abdominal pain, constipation, diarrhea, nausea and vomiting.  Genitourinary:  Negative for dysuria.  Musculoskeletal:  Negative for arthralgias, back pain and myalgias.  Skin:  Negative for color change, rash and wound.  Neurological:  Negative for dizziness, weakness and headaches.  Psychiatric/Behavioral:  Negative for behavioral problems. The patient is not nervous/anxious.     Health Maintenance  Topic Date Due   DTaP/Tdap/Td (3 - Td or Tdap) 07/06/2017   Cervical Cancer Screening (HPV/Pap Cotest)  08/15/2018   Mammogram  03/17/2026   Colonoscopy  03/24/2034   Hepatitis C Screening  Completed   HIV Screening  Completed   Hepatitis B Vaccines 19-59 Average Risk  Aged Out   HPV VACCINES  Aged Out   Meningococcal B Vaccine  Aged Out   Pneumococcal Vaccine: 50+ Years  Discontinued   Influenza Vaccine  Discontinued   COVID-19 Vaccine  Discontinued   Zoster Vaccines- Shingrix  Discontinued    Physical Exam: Vitals:   06/04/24 1420  BP: 124/62  Pulse: 70  Resp: 18  Temp: 98.1 F (36.7 C)  SpO2: 98%  Weight: 211 lb 12.8 oz (96.1 kg)  Height: 5' 8 (1.727 m)   Body mass index is 32.2 kg/m. Physical Exam Constitutional:      General: She is not in acute distress.    Appearance: She is obese.  HENT:     Head: Normocephalic and atraumatic.     Nose: Nose normal.     Mouth/Throat:     Mouth: Mucous membranes are moist.  Eyes:     Conjunctiva/sclera: Conjunctivae normal.  Cardiovascular:     Rate and Rhythm: Normal rate and regular rhythm.  Pulmonary:     Effort: Pulmonary effort is normal.     Breath sounds: Normal breath sounds.  Abdominal:     General: Bowel sounds are normal.     Palpations: Abdomen is soft.   Musculoskeletal:        General: Normal range of motion.     Cervical back: Normal range of motion.  Skin:    General: Skin is warm and dry.  Neurological:     General: No focal deficit present.     Mental Status: She is alert and oriented to person, place, and time.  Psychiatric:        Mood and Affect: Mood normal.        Behavior: Behavior normal.        Thought Content: Thought content normal.        Judgment: Judgment normal.  Labs reviewed: Basic Metabolic Panel: Recent Labs    01/20/24 0808 02/21/24 1329  NA 144 140  K 4.2 4.2  CL 107 105  CO2 25 24  GLUCOSE 107* 85  BUN 18 20  CREATININE 0.89 0.73  CALCIUM 9.5 9.5  TSH 0.88  --    Liver Function Tests: Recent Labs    01/20/24 0808 02/21/24 1329  AST 15 18  ALT 18 22  BILITOT 0.4 0.3  PROT 6.5 6.7   No results for input(s): LIPASE, AMYLASE in the last 8760 hours. No results for input(s): AMMONIA in the last 8760 hours. CBC: Recent Labs    01/20/24 0808  WBC 7.1  NEUTROABS 3,898  HGB 15.2  HCT 47.1*  MCV 95.2  PLT 191   Lipid Panel: Recent Labs    01/20/24 0808 02/21/24 1329  CHOL 252* 197  HDL 47* 37*  LDLCALC 167* 869*  TRIG 227* 162*  CHOLHDL 5.4* 5.3*   No results found for: HGBA1C  Procedures since last visit: No results found.  Assessment/Plan  1. Tobacco use -  Chronic tobacco use disorder with a strong desire to quit. - Prescribed Chantix (varenicline) 0.5 mg once daily for three days, then 0.5 mg twice daily starting on the fourth day. Dispense 63 tablets. - Advised to pick up prescription at [facility] on [address]. - Encouraged smoking cessation with a goal to quit by next visit. - varenicline (CHANTIX) 0.5 MG tablet; Take 1 tablet (0.5 mg total) by mouth daily for 3 days. Then take 1 tablet twice a day  Dispense: 3 tablet; Refill: 0 - varenicline (CHANTIX) 0.5 MG tablet; Take 1 tablet (0.5 mg total) by mouth 2 (two) times daily.  Dispense: 60 tablet; Refill:  3  2. Adult hypothyroidism -  Well-controlled hypothyroidism on levothyroxine  150 mcg daily. Recent TSH level stable at 0.88. - Continue levothyroxine  150 mcg daily.  3. Class 1 obesity with body mass index (BMI) of 32.0 to 32.9 in adult, unspecified obesity type, unspecified whether serious comorbidity present -  Obesity with BMI 32.2. Successful weight loss of 45 pounds since January 22, 2024. Aims to lose an additional 25 pounds. - Continue dietary regimen focusing on fruits, vegetables, and smoothies. - Encourage continued physical activity, including swimming at [facility]. - Set goal to lose an additional 25 pounds by next visit.  4. Numbness of fingers -  Chronic numbness and pain in fingers with pending neurologist evaluation. - Attend rheumatologist appointment on June 25, 2024     General Health Maintenance Cologuard negative. Pap smear pending. Tetanus vaccine due. - Schedule and complete Pap smear. - Obtain tetanus vaccine from pharmacy when available and provide documentation at next visit.    Labs/tests ordered:   None   Return in about 6 months (around 12/03/2024).  Kentrail Shew Medina-Vargas, NP

## 2024-06-25 ENCOUNTER — Encounter: Payer: Self-pay | Admitting: Internal Medicine

## 2024-06-25 ENCOUNTER — Ambulatory Visit: Attending: Internal Medicine | Admitting: Internal Medicine

## 2024-06-25 VITALS — BP 118/75 | HR 64 | Temp 97.8°F | Resp 16 | Ht 68.0 in | Wt 211.0 lb

## 2024-06-25 DIAGNOSIS — Z720 Tobacco use: Secondary | ICD-10-CM | POA: Diagnosis present

## 2024-06-25 DIAGNOSIS — M19041 Primary osteoarthritis, right hand: Secondary | ICD-10-CM | POA: Insufficient documentation

## 2024-06-25 DIAGNOSIS — R7982 Elevated C-reactive protein (CRP): Secondary | ICD-10-CM | POA: Insufficient documentation

## 2024-06-25 DIAGNOSIS — M19042 Primary osteoarthritis, left hand: Secondary | ICD-10-CM | POA: Diagnosis present

## 2024-06-25 DIAGNOSIS — G5603 Carpal tunnel syndrome, bilateral upper limbs: Secondary | ICD-10-CM | POA: Diagnosis present

## 2024-06-25 DIAGNOSIS — G5600 Carpal tunnel syndrome, unspecified upper limb: Secondary | ICD-10-CM | POA: Insufficient documentation

## 2024-06-25 NOTE — Patient Instructions (Signed)
        Supplements for Osteoarthritis  Natural anti-inflammatories can help reduce inflammation and joint stiffness without some of the harmful side effects of non-steroidal anti-inflammatories (Advil, Motrin, Aleve , etc)  Recommend starting with one supplement and give a 3-4 week trial period before adding another  You may be able to find some of these products at your local pharmacy, but may also purchase at [employer], other specialty stores, or online   Turmeric Recommended dose 400mg  once a day May increase to twice a day if tolerated (may cause stomach upset) Do not take if you are on a blood thinner, and stop prior to surgery   Ginger (root or capsules) Recommended dose is 2 grams twice daily, or 2 cups of tea daily Do not take if you are on a blood thinner, and stop prior to surgery   Fish Oil or Omega 3 Recommended dose for capsule is 2 grams twice daily (make sure it contains at least 30% of EPA/DHA) For food, two 3-ounce servings of fish a week, or flaxseed, chia seeds, walnuts, and almonds   Tart Cherry (dried, extract, or tablets) Recommended dose is 500mg  once a day   *Although these are natural products, they can still interact with medications. Always consult with your doctor or pharmacist when starting new supplements and/or medications*  *Patents should be under the care of a physician or other medical provide while taking these supplements*

## 2024-06-25 NOTE — Progress Notes (Signed)
 Office Visit Note  Patient: Maria Robles             Date of Birth: 03/17/1966           MRN: [id number]             PCP: Phyllis Jereld BROCKS, NP Referring: Phyllis Jereld BROCKS* Visit Date: 06/25/2024 Occupation: Data Unavailable  Subjective:  New Patient (Initial Visit) (Fever under skin x 3 years, deformed fingers, inflammation, aches, pains, brain fog due to COVID vaccine, flares, bil hand pain)   Discussed the use of AI scribe software for clinical note transcription with the patient, who gave verbal consent to proceed.  History of Present Illness   Maria Robles is a 58 year old female with arthritis who presents with hand pain, weakness, numbness and tingling. She also complains about episodic symptoms with fatigue, body aches, and subjective fevers.  She experiences persistent numbness and tingling in her hands, particularly at night, describing them as 'completely numb' when she goes to sleep. This numbness affects her daily activities, with decreased functionality in her left hand compared to her right. She has a history of working in IT, involving extensive typing, which she suspects may contribute to her symptoms.  Over the past couple of years, she has experienced joint changes in her hands, with symptoms persisting despite lifestyle changes. She notices some new nodules appearing, deviation of finger joints, and decreased grip tightness and mobility. She has lost nearly 50 pounds over the past five months through dietary changes, including eating only whole foods and eliminating processed foods and alcohol. She has also quit smoking and started swimming regularly.  She has a history of slightly elevated CRP levels, with a previous result of 11.9. Rheumatoid factor and sed rate were normal. She also has a history of thyroid  issues and is on supplementation for low thyroid  function.  She reports episodes of 'flares' characterized by a 'fever under the  skin' feeling, body aches, and fatigue, lasting several days. These episodes have decreased in frequency with dietary changes but are still present. Consuming sugar or MSG can quickly trigger these symptoms.  No visible skin flushing, rashes, or excessive sweating during flares. Reports some lymph node swelling, particularly in the breast area, which was evaluated by another clinician.       Activities of Daily Living:  Patient reports morning stiffness for 4 hours.   Patient Reports nocturnal pain.  Difficulty dressing/grooming: Denies Difficulty climbing stairs: Denies Difficulty getting out of chair: Denies Difficulty using hands for taps, buttons, cutlery, and/or writing: Reports  Review of Systems  Constitutional:  Positive for fatigue.  HENT:  Negative for mouth sores and mouth dryness.   Eyes:  Negative for dryness.  Respiratory:  Negative for shortness of breath.   Cardiovascular:  Negative for chest pain and palpitations.  Gastrointestinal:  Negative for blood in stool, constipation and diarrhea.  Endocrine: Positive for increased urination.  Genitourinary:  Negative for involuntary urination.  Musculoskeletal:  Positive for joint pain, gait problem, joint pain, joint swelling, myalgias, morning stiffness, muscle tenderness and myalgias. Negative for muscle weakness.  Skin:  Negative for color change, rash, hair loss and sensitivity to sunlight.  Allergic/Immunologic: Negative for susceptible to infections.  Neurological:  Negative for dizziness and headaches.  Hematological:  Negative for swollen glands.  Psychiatric/Behavioral:  Negative for depressed mood and sleep disturbance. The patient is not nervous/anxious.     PMFS History:  Patient Active Problem List   Diagnosis  Date Noted   Tobacco abuse 06/25/2024   Osteoarthritis of hands, bilateral 06/25/2024   Carpal tunnel syndrome 06/25/2024   CRP elevated 06/25/2024   Mass overlapping multiple quadrants of right  breast 11/10/2020   Chronic diastolic congestive heart failure (HCC) 11/06/2020   Atypical chest pain 09/19/2020   CKD (chronic kidney disease) stage 2, GFR 60-89 ml/min 09/19/2020   Colon cancer screening 09/19/2020   Swallowing difficulty 09/19/2020   Hyperlipidemia 04/27/2016   H/O urinary tract infection 03/26/2015   Adult hypothyroidism 03/26/2015   Granuloma annulare    Essential hypertension 02/22/2015   Low back pain 12/25/2010   OBESITY 12/07/2008    Past Medical History:  Diagnosis Date   Essential hypertension    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   Hypothyroidism 1997   Osteoarthritis    Recurrent UTI 1990s    Family History  Problem Relation Age of Onset   Tremor Mother        Familial essential tremor   GER disease Mother    Arrhythmia Mother        History of what sounds like palpitations, but no findings on monitor   Depression Mother        On low dose Prozac since patient a young girl   Asthma Sister    Drug abuse Son    Diabetes Neg Hx    Heart attack Neg Hx    Hypertension Neg Hx    Coronary artery disease Neg Hx    Stroke Neg Hx    Cancer Neg Hx        breast or colon    Past Surgical History:  Procedure Laterality Date   APPENDECTOMY  2008   benign tumor Right Late 1990s   R shoulder    left tooth extraction Left 06/2015   with associated abscess   TUBAL LIGATION  1993   Social History   Tobacco Use   Smoking status: Former    Current packs/day: 0.20    Average packs/day: 0.2 packs/day for 47.0 years (9.4 ttl pk-yrs)    Types: Cigarettes    Passive exposure: Past   Smokeless tobacco: Never   Tobacco comments:    3-4 cigarettes daily  Vaping Use   Vaping status: Never Used  Substance Use Topics   Alcohol use: Not Currently   Drug use: No   Social History   Social History Narrative   Married, 2 boys-1 grandchild.          Immunization History  Administered Date(s) Administered   DTaP 07/07/2007    Influenza-Unspecified 05/26/2015   PFIZER(Purple Top)SARS-COV-2 Vaccination 11/05/2019, 11/30/2019   Tdap 07/07/2007     Objective: Vital Signs: BP 118/75 (BP Location: Left Arm, Patient Position: Sitting, Cuff Size: Normal)   Pulse 64   Temp 97.8 F (36.6 C)   Resp 16   Ht 5' 8 (1.727 m)   Wt 211 lb (95.7 kg)   LMP  (LMP Unknown)   BMI 32.08 kg/m    Physical Exam Constitutional:      Appearance: She is obese.  HENT:     Mouth/Throat:     Mouth: Mucous membranes are moist.     Pharynx: Oropharynx is clear.  Eyes:     Conjunctiva/sclera: Conjunctivae normal.  Cardiovascular:     Rate and Rhythm: Normal rate and regular rhythm.  Pulmonary:     Effort: Pulmonary effort is normal.     Breath sounds: Normal breath sounds.  Musculoskeletal:  Right lower leg: No edema.     Left lower leg: No edema.  Lymphadenopathy:     Cervical: No cervical adenopathy.  Skin:    General: Skin is warm and dry.     Findings: No rash.  Neurological:     Mental Status: She is alert.  Psychiatric:        Mood and Affect: Mood normal.      Musculoskeletal Exam:  Shoulders full ROM no tenderness or swelling Elbows full ROM no tenderness or swelling Wrists full ROM no tenderness or swelling, ganglion cyst near left FCR insertion Fingers full ROM no swelling, mild distal heberdon's nodes and base of thumbs, left 1st CMC tenderness Mild loss of thenar muscle on left side, percussion over wrists provokes radiating pain on left side Knees full ROM no tenderness or swelling Ankles full ROM no tenderness or swelling  Limited musculoskeletal ultrasound exam of the wrist shows left median nerve with increased cross-sectional area of 0.17 cm and right median nerve cross-sectional area of 0.1 cm  Investigation: No additional findings.  Imaging: No results found.  Recent Labs: Lab Results  Component Value Date   WBC 7.1 01/20/2024   HGB 15.2 01/20/2024   PLT 191 01/20/2024   NA 140  02/21/2024   K 4.2 02/21/2024   CL 105 02/21/2024   CO2 24 02/21/2024   GLUCOSE 85 02/21/2024   BUN 20 02/21/2024   CREATININE 0.73 02/21/2024   BILITOT 0.3 02/21/2024   ALKPHOS 68 09/01/2015   AST 18 02/21/2024   ALT 22 02/21/2024   PROT 6.7 02/21/2024   ALBUMIN 3.6 09/01/2015   CALCIUM 9.5 02/21/2024   GFRAA >60 08/27/2019    Speciality Comments: No specialty comments available.  Procedures:  No procedures performed Allergies: Sulfa antibiotics and Cat dander   Assessment / Plan:     Visit Diagnoses: Primary osteoarthritis of both hands Chronic osteoarthritis with significant pain and functional impairment, especially in the left hand. Muscle atrophy noted in the left thumb base. Ganglion cyst with swelling and pain, exacerbated by thumb deviation.  Overall I think this fits with primary osteoarthritis of the hands but she has some superimposed complications of carpal tunnel syndrome that are making her pain and function worse than the amount of structural change would otherwise account for.  Currently working on this through anti-inflammatory diet supplements and is making great progress in this regard.  Provided information on osteoarthritis and supplements to review. - With no clinically apparent inflammatory disease and improvement in symptoms since time of referral I recommended against doing extensive lab testing or imaging at this time.  Bilateral carpal tunnel syndrome Hand numbness highly consistent with carpal tunnel syndrome and ultrasound exam reveals much worsened swelling of the left compared to right wrist also matches up with her symptoms.  I am not sure if the mild thenar muscle atrophy on her left hand is due to osteoarthritis of the first [facility] joint, chronic FCR or de Quervain's tenosynovitis, or due to chronic compressive myelopathy at the wrist.  She has not been taking any interventions with this so far. - Recommended initially starting with hand stretching  exercises and use of a immobilizing wrist brace overnight at least on the left side for the next 4 to 6 weeks.  Very low threshold to refer for nerve conduction study given the suspected muscle involvement and chronicity.  Tobacco abuse Smoking cessation advised for arthritis management and she is already working on this just recently. Using patch  for pharmacological aid. Extensive Hx, reports 1ppd since age 54.  CRP elevated Mild high CRP from June but no clinical signs of active inflammation process at this time.  Discussed a low positive CRP could be explained by obesity and by chronic smoking both of which are now improved.  The initial episode with 3 months of debility and ongoing episodes may be consistent with a chronic fatigue syndrome/myalgic encephalomyelitis picture.    Orders: No orders of the defined types were placed in this encounter.  No orders of the defined types were placed in this encounter.    Follow-Up Instructions: Return if symptoms worsen or fail to improve.   Lonni LELON Ester, MD  Note - This record has been created using [employer].  Chart creation errors have been sought, but may not always  have been located. Such creation errors do not reflect on  the standard of medical care.

## 2024-07-24 ENCOUNTER — Telehealth: Payer: Self-pay

## 2024-07-24 NOTE — Telephone Encounter (Signed)
Opened in error encounter

## 2024-08-13 ENCOUNTER — Other Ambulatory Visit: Payer: Self-pay | Admitting: Adult Health

## 2024-08-13 DIAGNOSIS — E039 Hypothyroidism, unspecified: Secondary | ICD-10-CM

## 2024-12-03 ENCOUNTER — Ambulatory Visit: Admitting: Adult Health
